# Patient Record
Sex: Male | Born: 1980 | Race: Black or African American | Hispanic: Yes | Marital: Single
Health system: Midwestern US, Community
[De-identification: ages and names within clinical notes are randomized; demographics above are authoritative.]

## PROBLEM LIST (undated history)

## (undated) DIAGNOSIS — E1165 Type 2 diabetes mellitus with hyperglycemia: Principal | ICD-10-CM

## (undated) DIAGNOSIS — J4520 Mild intermittent asthma, uncomplicated: Secondary | ICD-10-CM

## (undated) DIAGNOSIS — E1149 Type 2 diabetes mellitus with other diabetic neurological complication: Secondary | ICD-10-CM

## (undated) DIAGNOSIS — N183 Chronic kidney disease, stage 3 unspecified: Secondary | ICD-10-CM

## (undated) DIAGNOSIS — J45909 Unspecified asthma, uncomplicated: Secondary | ICD-10-CM

## (undated) DIAGNOSIS — E669 Obesity, unspecified: Secondary | ICD-10-CM

## (undated) DIAGNOSIS — E1122 Type 2 diabetes mellitus with diabetic chronic kidney disease: Secondary | ICD-10-CM

## (undated) HISTORY — PX: CHOLECYSTECTOMY: SHX55

---

## 2015-08-23 ENCOUNTER — Inpatient Hospital Stay (HOSPITAL_COMMUNITY)
Admission: EM | Admit: 2015-08-23 | Discharge: 2015-08-25 | DRG: 638 | Disposition: A | Discharge status: Home / Self Care | Payer: BLUE CROSS/BLUE SHIELD | Attending: Internal Medicine | Admitting: Internal Medicine

## 2015-08-23 ENCOUNTER — Encounter (HOSPITAL_COMMUNITY): Payer: Self-pay

## 2015-08-23 ENCOUNTER — Emergency Department (HOSPITAL_COMMUNITY): Payer: BLUE CROSS/BLUE SHIELD

## 2015-08-23 DIAGNOSIS — K219 Gastro-esophageal reflux disease without esophagitis: Secondary | ICD-10-CM | POA: Diagnosis present

## 2015-08-23 DIAGNOSIS — N179 Acute kidney failure, unspecified: Secondary | ICD-10-CM

## 2015-08-23 DIAGNOSIS — E876 Hypokalemia: Secondary | ICD-10-CM | POA: Diagnosis present

## 2015-08-23 DIAGNOSIS — E875 Hyperkalemia: Secondary | ICD-10-CM | POA: Diagnosis present

## 2015-08-23 DIAGNOSIS — J4520 Mild intermittent asthma, uncomplicated: Secondary | ICD-10-CM | POA: Diagnosis not present

## 2015-08-23 DIAGNOSIS — R634 Abnormal weight loss: Secondary | ICD-10-CM

## 2015-08-23 DIAGNOSIS — E872 Acidosis: Secondary | ICD-10-CM | POA: Diagnosis present

## 2015-08-23 DIAGNOSIS — E781 Pure hyperglyceridemia: Secondary | ICD-10-CM | POA: Diagnosis present

## 2015-08-23 DIAGNOSIS — E669 Obesity, unspecified: Secondary | ICD-10-CM

## 2015-08-23 DIAGNOSIS — R739 Hyperglycemia, unspecified: Secondary | ICD-10-CM

## 2015-08-23 DIAGNOSIS — Z23 Encounter for immunization: Secondary | ICD-10-CM

## 2015-08-23 DIAGNOSIS — E860 Dehydration: Secondary | ICD-10-CM | POA: Diagnosis present

## 2015-08-23 DIAGNOSIS — Z6831 Body mass index (BMI) 31.0-31.9, adult: Secondary | ICD-10-CM | POA: Diagnosis not present

## 2015-08-23 DIAGNOSIS — Z833 Family history of diabetes mellitus: Secondary | ICD-10-CM | POA: Diagnosis not present

## 2015-08-23 DIAGNOSIS — R7309 Other abnormal glucose: Secondary | ICD-10-CM

## 2015-08-23 DIAGNOSIS — E1165 Type 2 diabetes mellitus with hyperglycemia: Secondary | ICD-10-CM | POA: Diagnosis present

## 2015-08-23 DIAGNOSIS — J45909 Unspecified asthma, uncomplicated: Secondary | ICD-10-CM

## 2015-08-23 DIAGNOSIS — R112 Nausea with vomiting, unspecified: Secondary | ICD-10-CM

## 2015-08-23 DIAGNOSIS — D696 Thrombocytopenia, unspecified: Secondary | ICD-10-CM

## 2015-08-23 HISTORY — DX: Unspecified asthma, uncomplicated: J45.909

## 2015-08-23 HISTORY — DX: Obesity, unspecified: E66.9

## 2015-08-23 LAB — URINALYSIS, ROUTINE W REFLEX MICROSCOPIC
BILIRUBIN URINE: NEGATIVE
Glucose, UA: >1000 mg/dL — AB
KETONES UR: 40 mg/dL — AB
LEUKOCYTES UA: NEGATIVE
NITRITE: NEGATIVE
PH: 5 (ref 5.0–8.0)
PROTEIN: NEGATIVE mg/dL
Specific Gravity, Urine: 1.035 — ABNORMAL HIGH (ref 1.005–1.030)
Urobilinogen, UA: 0.2 mg/dL (ref 0.0–1.0)

## 2015-08-23 LAB — COMPREHENSIVE METABOLIC PANEL
ALK PHOS: 103 U/L (ref 38–126)
ALT: 35 U/L (ref 17–63)
ANION GAP: 20 — AB (ref 5–15)
AST: 32 U/L (ref 15–41)
Albumin: 3.9 g/dL (ref 3.5–5.0)
BILIRUBIN TOTAL: 1.3 mg/dL — AB (ref 0.3–1.2)
BUN: 31 mg/dL — ABNORMAL HIGH (ref 6–20)
CALCIUM: 9.6 mg/dL (ref 8.9–10.3)
CO2: 17 mmol/L — ABNORMAL LOW (ref 22–32)
Chloride: 86 mmol/L — ABNORMAL LOW (ref 101–111)
Creatinine, Ser: 2.41 mg/dL — ABNORMAL HIGH (ref 0.61–1.24)
GFR, EST AFRICAN AMERICAN: 38 mL/min — AB (ref >60)
GFR, EST NON AFRICAN AMERICAN: 33 mL/min — AB (ref >60)
GLUCOSE: 1395 mg/dL — AB (ref 65–99)
POTASSIUM: 6.2 mmol/L — AB (ref 3.5–5.1)
Sodium: 123 mmol/L — ABNORMAL LOW (ref 135–145)
TOTAL PROTEIN: 9.1 g/dL — AB (ref 6.5–8.1)

## 2015-08-23 LAB — CBC WITH DIFFERENTIAL/PLATELET
BASOS ABS: 0 10*3/uL (ref 0.0–0.1)
BASOS PCT: 0 %
EOS ABS: 0 10*3/uL (ref 0.0–0.7)
Eosinophils Relative: 0 %
HCT: 44.1 % (ref 39.0–52.0)
HEMOGLOBIN: 15.7 g/dL (ref 13.0–17.0)
Lymphocytes Relative: 19 %
Lymphs Abs: 1.3 10*3/uL (ref 0.7–4.0)
MCH: 27.6 pg (ref 26.0–34.0)
MCHC: 35.6 g/dL (ref 30.0–36.0)
MCV: 77.5 fL — ABNORMAL LOW (ref 78.0–100.0)
MONO ABS: 0.3 10*3/uL (ref 0.1–1.0)
MONOS PCT: 4 %
NEUTROS ABS: 5.4 10*3/uL (ref 1.7–7.7)
NEUTROS PCT: 77 %
Platelets: 90 10*3/uL — ABNORMAL LOW (ref 150–400)
RBC: 5.69 MIL/uL (ref 4.22–5.81)
RDW: 12.4 % (ref 11.5–15.5)
WBC: 6.9 10*3/uL (ref 4.0–10.5)

## 2015-08-23 LAB — BASIC METABOLIC PANEL
ANION GAP: 19 — AB (ref 5–15)
BUN: 28 mg/dL — ABNORMAL HIGH (ref 6–20)
CALCIUM: 9.9 mg/dL (ref 8.9–10.3)
CHLORIDE: 102 mmol/L (ref 101–111)
CO2: 17 mmol/L — AB (ref 22–32)
Creatinine, Ser: 2.14 mg/dL — ABNORMAL HIGH (ref 0.61–1.24)
GFR calc non Af Amer: 38 mL/min — ABNORMAL LOW (ref >60)
GFR, EST AFRICAN AMERICAN: 44 mL/min — AB (ref >60)
Glucose, Bld: 730 mg/dL (ref 65–99)
POTASSIUM: 4.5 mmol/L (ref 3.5–5.1)
Sodium: 138 mmol/L (ref 135–145)

## 2015-08-23 LAB — CBG MONITORING, ED
Glucose-Capillary: >600 mg/dL (ref 65–99)
Glucose-Capillary: >600 mg/dL (ref 65–99)

## 2015-08-23 LAB — URINE MICROSCOPIC-ADD ON

## 2015-08-23 LAB — I-STAT VENOUS BLOOD GAS, ED
Acid-base deficit: 7 mmol/L — ABNORMAL HIGH (ref 0.0–2.0)
Bicarbonate: 18.9 meq/L — ABNORMAL LOW (ref 20.0–24.0)
O2 Saturation: 84 %
TCO2: 20 mmol/L (ref 0–100)
pCO2, Ven: 38.4 mmHg — ABNORMAL LOW (ref 45.0–50.0)
pH, Ven: 7.3 (ref 7.250–7.300)
pO2, Ven: 54 mmHg — ABNORMAL HIGH (ref 30.0–45.0)

## 2015-08-23 LAB — MAGNESIUM: Magnesium: 3 mg/dL — ABNORMAL HIGH (ref 1.7–2.4)

## 2015-08-23 LAB — LIPASE, BLOOD: Lipase: 42 U/L (ref 11–51)

## 2015-08-23 LAB — BETA-HYDROXYBUTYRIC ACID: BETA-HYDROXYBUTYRIC ACID: 4.9 mmol/L — AB (ref 0.05–0.27)

## 2015-08-23 LAB — GLUCOSE, CAPILLARY: Glucose-Capillary: 453 mg/dL — ABNORMAL HIGH (ref 65–99)

## 2015-08-23 LAB — MRSA PCR SCREENING: MRSA BY PCR: NEGATIVE

## 2015-08-23 LAB — PHOSPHORUS: PHOSPHORUS: 3.7 mg/dL (ref 2.5–4.6)

## 2015-08-23 MED ORDER — DEXTROSE-NACL 5-0.45 % IV SOLN
INTRAVENOUS | Status: DC
Start: 2015-08-23 — End: 2015-08-23

## 2015-08-23 MED ORDER — SODIUM CHLORIDE 0.9 % IV SOLN
INTRAVENOUS | Status: DC
  Administered 2015-08-23: 5.4 [IU]/h via INTRAVENOUS
  Filled 2015-08-23: qty 2.5

## 2015-08-23 MED ORDER — PANTOPRAZOLE SODIUM 40 MG PO TBEC
40.0000 mg | DELAYED_RELEASE_TABLET | Freq: Every day | ORAL | Status: DC
  Administered 2015-08-23 – 2015-08-25 (×3): 40 mg via ORAL
  Filled 2015-08-23 (×3): qty 1

## 2015-08-23 MED ORDER — INSULIN REGULAR HUMAN 100 UNIT/ML IJ SOLN: INTRAMUSCULAR | Status: DC

## 2015-08-23 MED ORDER — DEXTROSE 50 % IV SOLN: 25.0000 mL | INTRAVENOUS | Status: DC | PRN

## 2015-08-23 MED ORDER — INFLUENZA VAC SPLIT QUAD 0.5 ML IM SUSY
0.5000 mL | PREFILLED_SYRINGE | INTRAMUSCULAR | Status: AC
  Administered 2015-08-24: 0.5 mL via INTRAMUSCULAR
  Filled 2015-08-23: qty 0.5

## 2015-08-23 MED ORDER — HEPARIN SODIUM (PORCINE) 5000 UNIT/ML IJ SOLN
5000.0000 [IU] | Freq: Three times a day (TID) | INTRAMUSCULAR | Status: DC
  Administered 2015-08-23 – 2015-08-25 (×6): 5000 [IU] via SUBCUTANEOUS
  Filled 2015-08-23 (×6): qty 1

## 2015-08-23 MED ORDER — ALBUTEROL SULFATE (2.5 MG/3ML) 0.083% IN NEBU: 2.5000 mg | INHALATION_SOLUTION | RESPIRATORY_TRACT | Status: AC | PRN

## 2015-08-23 MED ORDER — DEXTROSE-NACL 5-0.45 % IV SOLN
INTRAVENOUS | Status: DC
  Administered 2015-08-24 (×2): via INTRAVENOUS

## 2015-08-23 MED ORDER — ALBUTEROL SULFATE (2.5 MG/3ML) 0.083% IN NEBU: 2.5000 mg | INHALATION_SOLUTION | Freq: Four times a day (QID) | RESPIRATORY_TRACT | Status: DC | PRN

## 2015-08-23 MED ORDER — ALBUTEROL SULFATE HFA 108 (90 BASE) MCG/ACT IN AERS: 2.0000 | INHALATION_SPRAY | Freq: Four times a day (QID) | RESPIRATORY_TRACT | Status: DC | PRN

## 2015-08-23 MED ORDER — SODIUM CHLORIDE 0.9 % IV BOLUS (SEPSIS)
1000.0000 mL | Freq: Once | INTRAVENOUS | Status: AC
  Administered 2015-08-23: 1000 mL via INTRAVENOUS

## 2015-08-23 MED ORDER — ALBUTEROL SULFATE (2.5 MG/3ML) 0.083% IN NEBU
2.5000 mg | INHALATION_SOLUTION | Freq: Four times a day (QID) | RESPIRATORY_TRACT | Status: DC | PRN
  Administered 2015-08-24: 2.5 mg via RESPIRATORY_TRACT
  Filled 2015-08-23: qty 3

## 2015-08-23 MED ORDER — ONDANSETRON HCL 4 MG/2ML IJ SOLN: 4.0000 mg | Freq: Three times a day (TID) | INTRAMUSCULAR | Status: AC | PRN

## 2015-08-23 MED ORDER — INSULIN REGULAR BOLUS VIA INFUSION
0.0000 [IU] | Freq: Three times a day (TID) | INTRAVENOUS | Status: DC
  Administered 2015-08-24: 3.9 [IU] via INTRAVENOUS
  Filled 2015-08-23: qty 10

## 2015-08-23 MED ORDER — SODIUM CHLORIDE 0.9 % IV SOLN
INTRAVENOUS | Status: DC
  Administered 2015-08-24: 02:00:00 via INTRAVENOUS

## 2015-08-23 MED ORDER — ONDANSETRON HCL 4 MG PO TABS: 4.0000 mg | ORAL_TABLET | Freq: Four times a day (QID) | ORAL | Status: DC | PRN

## 2015-08-23 MED ORDER — ONDANSETRON HCL 4 MG/2ML IJ SOLN: 4.0000 mg | Freq: Four times a day (QID) | INTRAMUSCULAR | Status: DC | PRN

## 2015-08-23 MED ORDER — SODIUM CHLORIDE 0.9 % IJ SOLN
3.0000 mL | Freq: Two times a day (BID) | INTRAMUSCULAR | Status: DC
  Administered 2015-08-24 – 2015-08-25 (×4): 3 mL via INTRAVENOUS

## 2015-08-23 MED ORDER — SODIUM CHLORIDE 0.9 % IV BOLUS (SEPSIS)
1000.0000 mL | Freq: Once | INTRAVENOUS | Status: AC
Start: 2015-08-23 — End: 2015-08-23
  Administered 2015-08-23: 1000 mL via INTRAVENOUS

## 2015-08-23 NOTE — ED Notes (Signed)
Developed generalized abdominal pain 5 days ago and yesterday began having n/v.   Mild loose stools.  Pt. Reports loosing 30  Lbs in 2 weeks.  Skin is warm and dry. Alert and oriented X4.

## 2015-08-23 NOTE — H&P (Addendum)
[facility] History and Physical  Jaymere Alen FAO:[id number] DOB: 1981-07-15 DOA: 08/23/2015  Referring physician: ED PCP: No primary care provider on file.   Chief Complaint:   HPI:  Mr. Gravley is a 34 y/o male with a past medical history significant for obesity, asthma, and GERD; who presents with nausea, vomiting, and abdominal pain. Symptoms initially started a week or so ago with just generalized malaise. Then approximately 5 days ago patient noted onset of generalized crampy like abdominal pain that would come and go. In the last 3 days he noted new symptoms of blurry vision. Patient is nearsighted and wears glasses at baseline. Associated symptoms include excessive thirst, frequency of urination, numbness tingling sensations intermittently. He denies any shortness of breath or chest pain symptoms.  In the last 2 weeks he went from weighing 266 pounds down to 236 pounds and family had begun to notice that he lost his belly. Patient denied working out lose the weight or any dietary changes. He also notes that his mom and dad both have diabetes. Patient notes today he continued to feel somewhat nauseous and was determined to vomit as he describes it feels like food just sits on his stomach. Determined to vomit proceeded to [employer] and got a meal with milkshake and vomited twice thereafter. Still not feeling well and he decided it was time to come to the emergency department. Once he was admitted into the emergency department blood glucose level was seen to be 1395.  Mr. Huckeby also brings up the fact that he has a history of asthma that he has not had an inhaler for some time. He reports the last time he was hospitalized for a asthma exacerbation was some time in June prior to the moving from [place] down to [place]. Patient also notes that the last time he had a primary care doctor was approximately a year ago.  Review of Systems  Constitutional: Positive for weight loss and  malaise/fatigue.  HENT: Negative for hearing loss and tinnitus.   Eyes: Positive for blurred vision. Negative for double vision and photophobia.  Respiratory: Positive for wheezing. Negative for cough and hemoptysis.   Cardiovascular: Negative for chest pain and palpitations.  Gastrointestinal: Positive for nausea, vomiting, abdominal pain and diarrhea. Negative for constipation.  Genitourinary: Positive for frequency. Negative for flank pain.  Musculoskeletal: Negative for falls and neck pain.  Skin: Negative for itching and rash.  Neurological: Positive for tremors. Negative for tingling and focal weakness.  Endo/Heme/Allergies: Positive for polydipsia. Does not bruise/bleed easily.  Psychiatric/Behavioral: Negative for hallucinations. The patient is not nervous/anxious.        Past Medical History  Diagnosis Date  . Asthma      Past Surgical History  Procedure Laterality Date  . Cholecystectomy        Social History:  reports that he has never smoked. He does not have any smokeless tobacco history on file. He reports that he does not drink alcohol or use illicit drugs. where does patient live--home  and with whom if at home? wife Can patient participate in ADLs? Yes  No Known Allergies  Family History  Problem Relation Age of Onset  . Diabetes Mother   . Diabetes Father        Prior to Admission medications   Medication Sig Start Date End Date Taking? Authorizing Provider  albuterol (PROVENTIL HFA;VENTOLIN HFA) 108 (90 BASE) MCG/ACT inhaler Inhale 2 puffs into the lungs every 6 (six) hours as needed for wheezing  or shortness of breath.   Yes Historical Provider, MD  albuterol (PROVENTIL) (2.5 MG/3ML) 0.083% nebulizer solution Take 2.5 mg by nebulization every 6 (six) hours as needed for wheezing or shortness of breath.   Yes Historical Provider, MD     Physical Exam: Filed Vitals:   08/23/15 1606 08/23/15 1831 08/23/15 1845 08/23/15 2015  BP: 135/88 138/90  143/99 140/90  Pulse: 112  101 98  Temp: 98 F (36.7 C)     TempSrc: Oral     Resp: Height: 6' (1.829 m)     Weight: 107.094 kg (236 lb 1.6 oz)     SpO2: 95% 96% 94% 96%     Constitutional: Vital signs reviewed. Patient is a obese gentleman that appears to be in no acute distress this time. Alert and oriented 3  Head: Normocephalic and atraumatic  Ear: TM normal bilaterally  Mouth: no erythema or exudates, dry mucous membranes with poor dentition Eyes: PERRL, EOMI, conjunctivae normal, No scleral icterus.  Neck: Supple, Trachea midline normal ROM, No JVD, mass, thyromegaly, or carotid bruit present.  Cardiovascular: Slightly tachycardic, S1 normal, S2 normal, no MRG, pulses symmetric and intact bilaterally  Pulmonary/Chest: CTAB, no wheezes, rales, or rhonchi  Abdominal: Soft. Non-tender, non-distended, bowel sounds are normal, no masses, organomegaly, or guarding present.  GU: no CVA tenderness Musculoskeletal: No joint deformities, erythema, or stiffness, ROM full and no nontender Ext: no edema and no cyanosis, pulses palpable bilaterally (DP and PT)  Hematology: no cervical, inginal, or axillary adenopathy.  Neurological: A&O x3, Strenght is normal and symmetric bilaterally, cranial nerve II-XII are grossly intact, no focal motor deficit, sensory intact to light touch bilaterally.  Skin: Warm, dry and intact. No rash, cyanosis, or clubbing. Poor skin turgor  Psychiatric: Normal mood and affect. speech and behavior is normal. Judgment and thought content normal. Cognition and memory are normal.      Data Review   Micro Results No results found for this or any previous visit (from the past 240 hour(s)).  Radiology Reports Dg Chest 2 View  08/23/2015  CLINICAL DATA:  Blurred vision, weakness and dizziness. EXAM: CHEST  2 VIEW COMPARISON:  None. FINDINGS: The cardiac silhouette, mediastinal and hilar contours are within normal limits. Day streaky density in the  left lower lobe could reflect atelectasis or scar. No pleural effusion. The bony thorax is intact. IMPRESSION: Streaky left lower lobe atelectasis or scarring. No definite infiltrates, effusions or edema. Electronically Signed   By: Rudie Meyer M.D.   On: 08/23/2015 19:19     CBC  Recent Labs Lab 08/23/15 1656  WBC 6.9  HGB 15.7  HCT 44.1  PLT 90*  MCV 77.5*  MCH 27.6  MCHC 35.6  RDW 12.4  LYMPHSABS 1.3  MONOABS 0.3  EOSABS 0.0  BASOSABS 0.0    Chemistries   Recent Labs Lab 08/23/15 1619  NA 123*  K 6.2*  CL 86*  CO2 17*  GLUCOSE 1395*  BUN 31*  CREATININE 2.41*  CALCIUM 9.6  AST 32  ALT 35  ALKPHOS 103  BILITOT 1.3*   ------------------------------------------------------------------------------------------------------------------ estimated creatinine clearance is 54.1 mL/min (by C-G formula based on Cr of 2.41). ------------------------------------------------------------------------------------------------------------------ No results for input(s): HGBA1C in the last 72 hours. ------------------------------------------------------------------------------------------------------------------ No results for input(s): CHOL, HDL, LDLCALC, TRIG, CHOLHDL, LDLDIRECT in the last 72 hours. ------------------------------------------------------------------------------------------------------------------ No results for input(s): TSH, T4TOTAL, T3FREE, THYROIDAB in the last 72 hours.  Invalid input(s): FREET3 ------------------------------------------------------------------------------------------------------------------ No results for input(s):  VITAMINB12, FOLATE, FERRITIN, TIBC, IRON, RETICCTPCT in the last 72 hours.  Coagulation profile No results for input(s): INR, PROTIME in the last 168 hours.  No results for input(s): DDIMER in the last 72 hours.  Cardiac Enzymes No results for input(s): CKMB, TROPONINI, MYOGLOBIN in the last 168 hours.  Invalid input(s):  CK ------------------------------------------------------------------------------------------------------------------ Invalid input(s): POCBNP   CBG:  Recent Labs Lab 08/23/15 1943  GLUCAP >600*       EKG: pending   Assessment/Plan Principal Problem:  Hyperglycemia: Acute. Patient with symptoms of nausea, vomiting, polydipsia, and urinary. frequency positive family history of diabetes. Initial blood glucose 1395, anion gap of 20, Venous pH 7.3, positive ketones on UA. Suspect DKA in a new onset diabetic. This likely is the cause of patient's rapid weight loss, blurred vision, N/V symptoms -Check beta hydroxybutyrate -Glucose stabilizer protocol -BMPs every 4 hours -IV fluids normal saline -Check mag and phosphorus -Diabetic education in a.m. -Modified diet as tolerated -zofran prn nausea   AKI (acute kidney injury):Acute. Baseline Cr unknown.  Cr 2.41 with BUN 34  on admission. Likely due to prerenal secondary to severe dehydration. -IVF -Check FeNa and US-renal -Follow up renal function by BMP -Avoid ACEI and NSAIDs   Asthma: Stable - prn Albuterol  -recommended Flu vaccine     Obesity: -Counseled the patient on the need of weight loss and exercise    Hyperkalemia: Potassium noted to be 6.2 on admission. -Check EKG now -BMP repeat   Thrombocytopenia (HCC) -rechecking CBC in am  Code Status:   full Family Communication: bedside Disposition Plan: admit   Total time spent 55 minutes.Greater than 50% of this time was spent in counseling, explanation of diagnosis, planning of further management, and coordination of care  Clydie BraunRondell A Tiasia Weberg [facility] Pager [phone]  If 7PM-7AM, please contact night-coverage [email or web address] Password [email or web address] 08/23/2015, 8:39 PM

## 2015-08-23 NOTE — ED Notes (Signed)
Glucose called of 1300 plus

## 2015-08-23 NOTE — ED Notes (Signed)
Attempted report 

## 2015-08-23 NOTE — ED Provider Notes (Signed)
CSN: [id number]     Arrival date & time 08/23/15  1544 History   First MD Initiated Contact with Patient 08/23/15 1810     Chief Complaint  Patient presents with  . Abdominal Pain   HPI  Nicholas Barrett is a 34 year old male with PMHx of asthma presenting with abdominal pain, nausea and vomiting. Patient reports that his abdominal pain began approximately 5 days ago. It came on gradually and is described as generalized and cramping. He is also having associated nausea and vomiting. He states that after vomiting the pain resolves for a short time but soon returns. He is also complaining of loose stools but states that this is common since he had a cholecystectomy 5 years ago. Denies blood in vomit or stool. He is also noted that his vision has been blurred over the past 3 days, he gives the example of not being able to see the TV as well as he used to. He is also complaining of swelling of the lower extremities. He noted some swelling over his ankles and feet over the past week. He is also complaining of a 30 pound weight loss over the past 2 weeks. He denies dieting or exercise. He states he feels that his mouth is very dry and he is constantly thirsty. He is also complaining of increased urinary frequency. He states that he does not have a primary care doctor is unsure when the last time he saw one was. He states that he has no personal history of diabetes but he has a strong family history of it. Denies fevers, chills, headaches, chest pain, shortness of breath, wheezing, dysuria or hematuria.  Past Medical History  Diagnosis Date  . Asthma    Past Surgical History  Procedure Laterality Date  . Cholecystectomy     No family history on file. Social History  Substance Use Topics  . Smoking status: Never Smoker   . Smokeless tobacco: None  . Alcohol Use: No    Review of Systems  Constitutional: Positive for fatigue and unexpected weight change. Negative for fever and chills.  Eyes: Positive for  visual disturbance.  Respiratory: Negative for cough, shortness of breath and wheezing.   Cardiovascular: Positive for leg swelling. Negative for chest pain and palpitations.  Gastrointestinal: Positive for nausea, vomiting, abdominal pain and diarrhea. Negative for abdominal distention.  Endocrine: Positive for polydipsia and polyuria.  Genitourinary: Negative for dysuria, hematuria and flank pain.  Musculoskeletal: Negative for myalgias and arthralgias.  Skin: Negative for rash.  Neurological: Negative for dizziness, syncope, weakness and headaches.  All other systems reviewed and are negative.     Allergies  Review of patient's allergies indicates no known allergies.  Home Medications   Prior to Admission medications   Medication Sig Start Date End Date Taking? Authorizing Provider  albuterol (PROVENTIL HFA;VENTOLIN HFA) 108 (90 BASE) MCG/ACT inhaler Inhale 2 puffs into the lungs every 6 (six) hours as needed for wheezing or shortness of breath.   Yes Historical Provider, MD  albuterol (PROVENTIL) (2.5 MG/3ML) 0.083% nebulizer solution Take 2.5 mg by nebulization every 6 (six) hours as needed for wheezing or shortness of breath.   Yes Historical Provider, MD   BP 131/83 mmHg  Pulse 98  Temp(Src) 98.1 F (36.7 C) (Oral)  Resp 17  Ht 6' (1.829 m)  Wt 230 lb 11.2 oz (104.645 kg)  BMI 31.28 kg/m2  SpO2 95% Physical Exam  Constitutional: He is oriented to person, place, and time. He appears  well-developed and well-nourished. No distress.  HENT:  Head: Normocephalic and atraumatic.  Mouth/Throat: Mucous membranes are dry.  Dry mucous membranes  Eyes: Conjunctivae and EOM are normal. Pupils are equal, round, and reactive to light. Right eye exhibits no discharge. Left eye exhibits no discharge. No scleral icterus.  Neck: Normal range of motion. Neck supple.  Cardiovascular: Normal rate, regular rhythm, normal heart sounds and intact distal pulses.   No peripheral edema.   Pulmonary/Chest: Effort normal and breath sounds normal. No respiratory distress. He has no wheezes. He has no rales.  Abdominal: Soft. Bowel sounds are normal. He exhibits no distension. There is no tenderness. There is no rebound and no guarding.  Abdomen is soft, nontender. No peritoneal signs.  Musculoskeletal: Normal range of motion. He exhibits no edema or tenderness.  No obvious deformities or edema. Patient moves all extremities spontaneously.  Neurological: He is alert and oriented to person, place, and time. He exhibits normal muscle tone. Coordination normal.  CNs intact. 5/5 strength in all major muscle groups. Sensation to light touch intact throughout.  Skin: Skin is warm and dry. No rash noted.  Psychiatric: He has a normal mood and affect. His behavior is normal.  Nursing note and vitals reviewed.   ED Course  Procedures (including critical care time) Labs Review Labs Reviewed  COMPREHENSIVE METABOLIC PANEL - Abnormal; Notable for the following:    Sodium 123 (*)    Potassium 6.2 (*)    Chloride 86 (*)    CO2 17 (*)    Glucose, Bld 1395 (*)    BUN 31 (*)    Creatinine, Ser 2.41 (*)    Total Protein 9.1 (*)    Total Bilirubin 1.3 (*)    GFR calc non Af Amer 33 (*)    GFR calc Af Amer 38 (*)    Anion gap 20 (*)    All other components within normal limits  URINALYSIS, ROUTINE W REFLEX MICROSCOPIC (NOT AT [facility]) - Abnormal; Notable for the following:    Specific Gravity, Urine 1.035 (*)    Glucose, UA >1000 (*)    Hgb urine dipstick TRACE (*)    Ketones, ur 40 (*)    All other components within normal limits  CBC WITH DIFFERENTIAL/PLATELET - Abnormal; Notable for the following:    MCV 77.5 (*)    Platelets 90 (*)    All other components within normal limits  BASIC METABOLIC PANEL - Abnormal; Notable for the following:    CO2 17 (*)    Glucose, Bld 730 (*)    BUN 28 (*)    Creatinine, Ser 2.14 (*)    GFR calc non Af Amer 38 (*)    GFR calc Af Amer 44 (*)     Anion gap 19 (*)    All other components within normal limits  MAGNESIUM - Abnormal; Notable for the following:    Magnesium 3.0 (*)    All other components within normal limits  BETA-HYDROXYBUTYRIC ACID - Abnormal; Notable for the following:    Beta-Hydroxybutyric Acid 4.90 (*)    All other components within normal limits  I-STAT VENOUS BLOOD GAS, ED - Abnormal; Notable for the following:    pCO2, Ven 38.4 (*)    pO2, Ven 54.0 (*)    Bicarbonate 18.9 (*)    Acid-base deficit 7.0 (*)    All other components within normal limits  CBG MONITORING, ED - Abnormal; Notable for the following:    Glucose-Capillary >600 (*)  All other components within normal limits  CBG MONITORING, ED - Abnormal; Notable for the following:    Glucose-Capillary >600 (*)    All other components within normal limits  MRSA PCR SCREENING  LIPASE, BLOOD  URINE MICROSCOPIC-ADD ON  PHOSPHORUS  CBC  COMPREHENSIVE METABOLIC PANEL    Imaging Review Dg Chest 2 View  08/23/2015  CLINICAL DATA:  Blurred vision, weakness and dizziness. EXAM: CHEST  2 VIEW COMPARISON:  None. FINDINGS: The cardiac silhouette, mediastinal and hilar contours are within normal limits. Day streaky density in the left lower lobe could reflect atelectasis or scar. No pleural effusion. The bony thorax is intact. IMPRESSION: Streaky left lower lobe atelectasis or scarring. No definite infiltrates, effusions or edema. Electronically Signed   By: Rudie Meyer M.D.   On: 08/23/2015 19:19   I have personally reviewed and evaluated these images and lab results as part of my medical decision-making.   EKG Interpretation None      MDM   Final diagnoses:  Hyperglycemia   Patient presenting with 5 day history of abdominal pain, nausea and vomiting. She has symptoms include blurred vision, weight loss, polydipsia and polyuria. Patient denies past medical history of diabetes. Slightly tachycardia to 101. Patient is nontoxic-appearing but does  seem to be slightly anxious. Abdomen is soft, nontender. Lungs are clear to auscultation bilaterally. Heart regular rate and rhythm. Moves all extremities spontaneously without peripheral edema. Noted to have glucose of 1395, potassium of 6.2, creatinine of 2.41, BUN of 31, anion gap of 19 and urine positive for ketones. Started patient on glucose stabilizer and fluids. Resulted hospitalist, Dr. Katrinka Blazing, for admission for DKA. Dr. Katrinka Blazing accepts patient to stepdown.    Rolm Gala Ariah Mower, PA-C 08/23/15 2358  Benjiman Core, MD 08/26/15 1150

## 2015-08-24 ENCOUNTER — Inpatient Hospital Stay (HOSPITAL_COMMUNITY): Payer: BLUE CROSS/BLUE SHIELD

## 2015-08-24 ENCOUNTER — Encounter (HOSPITAL_COMMUNITY): Payer: Self-pay | Admitting: Internal Medicine

## 2015-08-24 DIAGNOSIS — R739 Hyperglycemia, unspecified: Secondary | ICD-10-CM

## 2015-08-24 DIAGNOSIS — R112 Nausea with vomiting, unspecified: Secondary | ICD-10-CM | POA: Diagnosis present

## 2015-08-24 LAB — CREATININE, URINE, RANDOM: Creatinine, Urine: 245.61 mg/dL

## 2015-08-24 LAB — COMPREHENSIVE METABOLIC PANEL
ALK PHOS: 80 U/L (ref 38–126)
ALT: 31 U/L (ref 17–63)
ANION GAP: 12 (ref 5–15)
AST: 33 U/L (ref 15–41)
Albumin: 3.8 g/dL (ref 3.5–5.0)
BUN: 23 mg/dL — ABNORMAL HIGH (ref 6–20)
CALCIUM: 9.8 mg/dL (ref 8.9–10.3)
CO2: 21 mmol/L — AB (ref 22–32)
Chloride: 115 mmol/L — ABNORMAL HIGH (ref 101–111)
Creatinine, Ser: 1.68 mg/dL — ABNORMAL HIGH (ref 0.61–1.24)
GFR calc non Af Amer: 51 mL/min — ABNORMAL LOW (ref >60)
GFR, EST AFRICAN AMERICAN: 59 mL/min — AB (ref >60)
Glucose, Bld: 244 mg/dL — ABNORMAL HIGH (ref 65–99)
Potassium: 4.1 mmol/L (ref 3.5–5.1)
SODIUM: 148 mmol/L — AB (ref 135–145)
Total Bilirubin: 1 mg/dL (ref 0.3–1.2)
Total Protein: 8.7 g/dL — ABNORMAL HIGH (ref 6.5–8.1)

## 2015-08-24 LAB — GLUCOSE, CAPILLARY
GLUCOSE-CAPILLARY: 165 mg/dL — AB (ref 65–99)
GLUCOSE-CAPILLARY: 179 mg/dL — AB (ref 65–99)
GLUCOSE-CAPILLARY: 189 mg/dL — AB (ref 65–99)
GLUCOSE-CAPILLARY: 235 mg/dL — AB (ref 65–99)
GLUCOSE-CAPILLARY: 304 mg/dL — AB (ref 65–99)
GLUCOSE-CAPILLARY: 325 mg/dL — AB (ref 65–99)
GLUCOSE-CAPILLARY: 350 mg/dL — AB (ref 65–99)
GLUCOSE-CAPILLARY: 439 mg/dL — AB (ref 65–99)
Glucose-Capillary: 266 mg/dL — ABNORMAL HIGH (ref 65–99)
Glucose-Capillary: 221 mg/dL — ABNORMAL HIGH (ref 65–99)
Glucose-Capillary: 203 mg/dL — ABNORMAL HIGH (ref 65–99)
Glucose-Capillary: 216 mg/dL — ABNORMAL HIGH (ref 65–99)
Glucose-Capillary: 183 mg/dL — ABNORMAL HIGH (ref 65–99)
Glucose-Capillary: 204 mg/dL — ABNORMAL HIGH (ref 65–99)
Glucose-Capillary: 299 mg/dL — ABNORMAL HIGH (ref 65–99)

## 2015-08-24 LAB — CBC
HCT: 46.7 % (ref 39.0–52.0)
HEMOGLOBIN: 16 g/dL (ref 13.0–17.0)
MCH: 27.5 pg (ref 26.0–34.0)
MCHC: 34.3 g/dL (ref 30.0–36.0)
MCV: 80.4 fL (ref 78.0–100.0)
PLATELETS: 246 10*3/uL (ref 150–400)
RBC: 5.81 MIL/uL (ref 4.22–5.81)
RDW: 12.8 % (ref 11.5–15.5)
WBC: 7.9 10*3/uL (ref 4.0–10.5)

## 2015-08-24 LAB — SODIUM, URINE, RANDOM: SODIUM UR: 23 mmol/L

## 2015-08-24 MED ORDER — INSULIN ASPART 100 UNIT/ML ~~LOC~~ SOLN
0.0000 [IU] | Freq: Three times a day (TID) | SUBCUTANEOUS | Status: DC
  Administered 2015-08-24: 8 [IU] via SUBCUTANEOUS

## 2015-08-24 MED ORDER — INSULIN ASPART 100 UNIT/ML ~~LOC~~ SOLN
0.0000 [IU] | Freq: Three times a day (TID) | SUBCUTANEOUS | Status: DC
  Administered 2015-08-24: 11 [IU] via SUBCUTANEOUS

## 2015-08-24 MED ORDER — INSULIN ASPART 100 UNIT/ML ~~LOC~~ SOLN
0.0000 [IU] | Freq: Three times a day (TID) | SUBCUTANEOUS | Status: DC
Start: 2015-08-25 — End: 2015-08-25
  Administered 2015-08-25: 11 [IU] via SUBCUTANEOUS
  Administered 2015-08-25: 15 [IU] via SUBCUTANEOUS
  Administered 2015-08-25: 11 [IU] via SUBCUTANEOUS

## 2015-08-24 MED ORDER — INSULIN GLARGINE 100 UNIT/ML ~~LOC~~ SOLN
40.0000 [IU] | Freq: Every day | SUBCUTANEOUS | Status: DC
  Administered 2015-08-24: 40 [IU] via SUBCUTANEOUS
  Filled 2015-08-24 (×2): qty 0.4

## 2015-08-24 MED ORDER — SODIUM CHLORIDE 0.45 % IV SOLN
INTRAVENOUS | Status: DC
  Administered 2015-08-24 – 2015-08-25 (×2): via INTRAVENOUS

## 2015-08-24 MED ORDER — INSULIN ASPART 100 UNIT/ML ~~LOC~~ SOLN
5.0000 [IU] | Freq: Three times a day (TID) | SUBCUTANEOUS | Status: DC
  Administered 2015-08-24 – 2015-08-25 (×3): 5 [IU] via SUBCUTANEOUS

## 2015-08-24 MED ORDER — INSULIN ASPART 100 UNIT/ML ~~LOC~~ SOLN
0.0000 [IU] | Freq: Every day | SUBCUTANEOUS | Status: DC
  Administered 2015-08-24: 4 [IU] via SUBCUTANEOUS

## 2015-08-24 MED ORDER — ALBUTEROL SULFATE HFA 108 (90 BASE) MCG/ACT IN AERS: 2.0000 | INHALATION_SPRAY | Freq: Four times a day (QID) | RESPIRATORY_TRACT | Status: DC | PRN

## 2015-08-24 MED ORDER — SODIUM CHLORIDE 0.9 % IV SOLN
INTRAVENOUS | Status: AC
  Administered 2015-08-24: 10.7 [IU]/h via INTRAVENOUS

## 2015-08-24 NOTE — Progress Notes (Signed)
Initial Nutrition Assessment  DOCUMENTATION CODES:  Obesity unspecified  INTERVENTION:  Gave DM education w/ associated handouts.   NUTRITION DIAGNOSIS:  Unintentional weight loss related to New onset DM as evidenced by reported loss of 30 lbs (11% bw) in 2 weeks   GOAL:  Patient will meet greater than or equal to 90% of their needs  MONITOR:  PO intake, Weight trends, Labs, I & O's (Education need)  REASON FOR ASSESSMENT:  Malnutrition Screening Tool    ASSESSMENT:  34 y/o male PMHx obesity, asmtha, GERD who presents with 1 week n/v/abdominal pain, 5 days of abdominal cramps and 3 days of blurry vision. Other symptoms include polyuria, polydipsia, numbness/tingling. Reports loss of 30 lbs in 2 weeks. Came to ED after vomiting from [employer] meal. In ED BG found to be 1395. Likely new onset DM.   Patient confirmed all of the above. As of right now he says his appetite is back and his symptoms have resolved with the insulin drip.   Though he has a family hx of DM, he reports knowing relatively little about the associated diet. RD was hesitant to provide education at this time due to perceived lethargy, but he assured RD that he could listen now.   RD provided "Diabetes Type 2 nutrition Therapy" Handout from the Academy of [employer] and Dietetics. Asked if pt knew what foods were counted as carbohydrates; he only knew grains. Discussed all different food groups and their effects on blood sugar.  Pt states that he does drink Sugar Sweetened Beverages (SSB). He drinks occasional sodas, juice and sweet tea. He is not interested in trying diet soda. He thought juice/fruit was very healthy. He says he will try artificial sweeteners in tea. Recommended water flavoring products.   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 He says he does not look for anything specific, just to make sure the food "isnt too bad".   Briefly explained how to carbohydrate count. Reccomended 5  servings per meal unless directed otherwise by MD. Provided list of carbohydrates and recommended serving sizes of common foods.   Discussed importance of controlled and consistent carbohydrate intake throughout the day. Provided "My Plate" template and talked about the correct ratios of food groups to optimally manage BG levels. Gave examples of ways to balance meals/snacks and encouraged intake of high-fiber, whole grain complex carbohydrates. Teach back method used.  Expect fair compliance. Pt was dozing off at parts of the conversation. May require I brief overview once he is more close to discharge3  [facility] contact information provided.   Pt says he is happy he lost the weight and declined any snacks/supplements at this time.   Diet Order:  Diet Carb Modified Fluid consistency:: Thin; Room service appropriate?: Yes  Skin:  Reviewed, no issues  Last BM:  unknown  Height:  Ht Readings from Last 1 Encounters:  08/23/15 6' (1.829 m)   Weight:  Wt Readings from Last 1 Encounters:  08/24/15 233 lb 4.8 oz (105.824 kg)   Dosing weightt: 230 lbs  Ideal Body Weight:  80.9 kg  BMI:  Body mass index is 31.63 kg/(m^2).  Estimated Nutritional Needs:  Kcal:  1900-2100 (18-20 kcal/kg bw) Protein:  81-97g (1-1.2 g/kg bw) Fluid:  1.9-2.1 liters  EDUCATION NEEDS:  Education needs addressed  Christophe LouisNathan Zamora Colton RD, LDN Nutrition Pager: [phone] 08/24/2015 10:35 AM

## 2015-08-24 NOTE — Progress Notes (Signed)
Pt transferred to 5W05, report given, condition stable.  Raymon MuttonGwen Fronia Depass RN

## 2015-08-24 NOTE — Progress Notes (Signed)
08/24/15  Call placed to 2600 to get report on patient, secretary stated nurse will [place] when [place].

## 2015-08-24 NOTE — Progress Notes (Deleted)
Pt's HR 130s-140s, currently 146. Otherwise HDS. Paged MD for med order to rate control. Will start dilt and continue to monitor. 

## 2015-08-24 NOTE — Progress Notes (Signed)
[facility] PROGRESS NOTE  Murl Golladay ZOX:[id number] DOB: 06-Feb-1981 DOA: 08/23/2015 PCP: No primary care provider on file.  Length of stay: 1   Assessment/Plan: Principal Problem:   Hyperglycemia Active Problems:   Weight loss   Asthma   Obesity   Hyperkalemia   AKI (acute kidney injury) (HCC)   Thrombocytopenia (HCC)   Nausea with vomiting    Mild diabetic ketoacidosis Started on DKA protocol overnight, anion gap is 20 upon admission, CBG 1395 CBG is improved Transition to Lantus, NovoLog pre-meal, sliding scale insulin Diabetes coordinator consult Check lipid panel  Acute kidney injury Creatinine improving to 2.14> 1.64 likely secondary to dehydration   Asthma-stable, continue when necessary albuterol  Hyperkalemia result  Morbid obesity Body mass index is 31.63 kg/(m^2). , Nutrition consult  Thrombocytopenia-resolved   DVT prophylaxsis heparin  Code Status:      Code Status Orders        Start     Ordered   08/23/15 2119  Full code   Continuous     08/23/15 2119     Family Communication: family updated about patient's clinical progress Disposition Plan:  Anticipate discharge tomorrow    Brief narrative: 34 y/o male with a past medical history significant for obesity, asthma, and GERD; who presents with nausea, vomiting, and abdominal pain. Symptoms initially started a week or so ago with just generalized malaise. Then approximately 5 days ago patient noted onset of generalized crampy like abdominal pain that would come and go. In the last 3 days he noted new symptoms of blurry vision. Patient is nearsighted and wears glasses at baseline. Associated symptoms include excessive thirst, frequency of urination, numbness tingling sensations intermittently. He denies any shortness of breath or chest pain symptoms. In the last 2 weeks he went from weighing 266 pounds down to 236 pounds and family had begun to notice that he lost his belly. Patient  denied working out lose the weight or any dietary changes. He also notes that his mom and dad both have diabetes. Patient notes today he continued to feel somewhat nauseous and was determined to vomit as he describes it feels like food just sits on his stomach. Determined to vomit proceeded to [employer] and got a meal with milkshake and vomited twice thereafter. Still not feeling well and he decided it was time to come to the emergency department. Once he was admitted into the emergency department blood glucose level was seen to be 1395.  Mr. Hollars also brings up the fact that he has a history of asthma that he has not had an inhaler for some time. He reports the last time he was hospitalized for a asthma exacerbation was some time in June prior to the moving from [place] down to [place]. Patient also notes that the last time he had a primary care doctor was approximately a year ago.  Consultants:  Diabetes coordinator  Procedures:  None  Antibiotics: Anti-infectives    None         HPI/Subjective: New-onset diabetes, does not complain of any chest pain shortness of breath, CBGs improved overnight  Objective: Filed Vitals:   08/24/15 0800 08/24/15 0900 08/24/15 1000 08/24/15 1100  BP: 109/86  117/93   Pulse: 100 101 91 100  Temp:      TempSrc:      Resp: Height:      Weight:      SpO2: 96% 95% 96% 95%  Intake/Output Summary (Last 24 hours) at 08/24/15 1141 Last data filed at 08/24/15 0900  Gross per 24 hour  Intake 3780.08 ml  Output   2475 ml  Net 1305.08 ml    Exam:  General: No acute respiratory distress Lungs: Clear to auscultation bilaterally without wheezes or crackles Cardiovascular: Regular rate and rhythm without murmur gallop or rub normal S1 and S2 Abdomen: Nontender, nondistended, soft, bowel sounds positive, no rebound, no ascites, no appreciable mass Extremities: No significant cyanosis, clubbing, or edema bilateral lower  extremities     Data Review   Micro Results Recent Results (from the past 240 hour(s))  MRSA PCR Screening     Status: None   Collection Time: 08/23/15  9:39 PM  Result Value Ref Range Status   MRSA by PCR NEGATIVE NEGATIVE Final    Comment:        The GeneXpert MRSA Assay (FDA approved for NASAL specimens only), is one component of a comprehensive MRSA colonization surveillance program. It is not intended to diagnose MRSA infection nor to guide or monitor treatment for MRSA infections.     Radiology Reports Dg Chest 2 View  08/23/2015  CLINICAL DATA:  Blurred vision, weakness and dizziness. EXAM: CHEST  2 VIEW COMPARISON:  None. FINDINGS: The cardiac silhouette, mediastinal and hilar contours are within normal limits. Day streaky density in the left lower lobe could reflect atelectasis or scar. No pleural effusion. The bony thorax is intact. IMPRESSION: Streaky left lower lobe atelectasis or scarring. No definite infiltrates, effusions or edema. Electronically Signed   By: Rudie MeyerP.  Gallerani M.D.   On: 08/23/2015 19:19     CBC  Recent Labs Lab 08/23/15 1656 08/24/15 0340  WBC 6.9 7.9  HGB 15.7 16.0  HCT 44.1 46.7  PLT 90* 246  MCV 77.5* 80.4  MCH 27.6 27.5  MCHC 35.6 34.3  RDW 12.4 12.8  LYMPHSABS 1.3  --   MONOABS 0.3  --   EOSABS 0.0  --   BASOSABS 0.0  --     Chemistries   Recent Labs Lab 08/23/15 1619 08/23/15 2217 08/24/15 0340  NA 123* 138 148*  K 6.2* 4.5 4.1  CL 86* 102 115*  CO2 17* 17* 21*  GLUCOSE 1395* 730* 244*  BUN 31* 28* 23*  CREATININE 2.41* 2.14* 1.68*  CALCIUM 9.6 9.9 9.8  MG  --  3.0*  --   AST 32  --  33  ALT 35  --  31  ALKPHOS 103  --  80  BILITOT 1.3*  --  1.0   ------------------------------------------------------------------------------------------------------------------ estimated creatinine clearance is 77.2 mL/min (by C-G formula based on Cr of  1.68). ------------------------------------------------------------------------------------------------------------------ No results for input(s): HGBA1C in the last 72 hours. ------------------------------------------------------------------------------------------------------------------ No results for input(s): CHOL, HDL, LDLCALC, TRIG, CHOLHDL, LDLDIRECT in the last 72 hours. ------------------------------------------------------------------------------------------------------------------ No results for input(s): TSH, T4TOTAL, T3FREE, THYROIDAB in the last 72 hours.  Invalid input(s): FREET3 ------------------------------------------------------------------------------------------------------------------ No results for input(s): VITAMINB12, FOLATE, FERRITIN, TIBC, IRON, RETICCTPCT in the last 72 hours.  Coagulation profile No results for input(s): INR, PROTIME in the last 168 hours.  No results for input(s): DDIMER in the last 72 hours.  Cardiac Enzymes No results for input(s): CKMB, TROPONINI, MYOGLOBIN in the last 168 hours.  Invalid input(s): CK ------------------------------------------------------------------------------------------------------------------ Invalid input(s): POCBNP   CBG:  Recent Labs Lab 08/24/15 0427 08/24/15 0523 08/24/15 0622 08/24/15 0718 08/24/15 0757  GLUCAP 235* 203* 216* 183* 179*       Studies: Dg Chest 2  View  08/23/2015  CLINICAL DATA:  Blurred vision, weakness and dizziness. EXAM: CHEST  2 VIEW COMPARISON:  None. FINDINGS: The cardiac silhouette, mediastinal and hilar contours are within normal limits. Day streaky density in the left lower lobe could reflect atelectasis or scar. No pleural effusion. The bony thorax is intact. IMPRESSION: Streaky left lower lobe atelectasis or scarring. No definite infiltrates, effusions or edema. Electronically Signed   By: Rudie Meyer M.D.   On: 08/23/2015 19:19      No results found for:  HGBA1C Lab Results  Component Value Date   CREATININE 1.68* 08/24/2015       Scheduled Meds: . heparin  5,000 Units Subcutaneous 3 times per day  . insulin aspart  0-15 Units Subcutaneous TID WC  . insulin aspart  5 Units Subcutaneous TID WC  . insulin glargine  40 Units Subcutaneous Daily  . insulin regular  0-10 Units Intravenous TID WC  . pantoprazole  40 mg Oral Daily  . sodium chloride  3 mL Intravenous Q12H   Continuous Infusions: . sodium chloride Stopped (08/24/15 0344)  . dextrose 5 % and 0.45% NaCl 150 mL/hr at 08/24/15 0708    Principal Problem:   Hyperglycemia Active Problems:   Weight loss   Asthma   Obesity   Hyperkalemia   AKI (acute kidney injury) (HCC)   Thrombocytopenia (HCC)   Nausea with vomiting    Time spent: 45 minutes   [facility]  [facility] Pager [phone]. If 7PM-7AM, please contact night-coverage at [email or web address], password [facility] 08/24/2015, 11:41 AM  LOS: 1 day

## 2015-08-25 LAB — COMPREHENSIVE METABOLIC PANEL
ALT: 30 U/L (ref 17–63)
AST: 44 U/L — AB (ref 15–41)
Albumin: 2.8 g/dL — ABNORMAL LOW (ref 3.5–5.0)
Alkaline Phosphatase: 64 U/L (ref 38–126)
Anion gap: 10 (ref 5–15)
BUN: 14 mg/dL (ref 6–20)
CHLORIDE: 103 mmol/L (ref 101–111)
CO2: 21 mmol/L — AB (ref 22–32)
Calcium: 8.3 mg/dL — ABNORMAL LOW (ref 8.9–10.3)
Creatinine, Ser: 1.49 mg/dL — ABNORMAL HIGH (ref 0.61–1.24)
GFR calc Af Amer: >60 mL/min (ref >60)
GFR, EST NON AFRICAN AMERICAN: 59 mL/min — AB (ref >60)
Glucose, Bld: 383 mg/dL — ABNORMAL HIGH (ref 65–99)
POTASSIUM: 3.3 mmol/L — AB (ref 3.5–5.1)
SODIUM: 134 mmol/L — AB (ref 135–145)
Total Bilirubin: 0.9 mg/dL (ref 0.3–1.2)
Total Protein: 6.6 g/dL (ref 6.5–8.1)

## 2015-08-25 LAB — CBC
HCT: 40.3 % (ref 39.0–52.0)
Hemoglobin: 13.4 g/dL (ref 13.0–17.0)
MCH: 27.1 pg (ref 26.0–34.0)
MCHC: 33.3 g/dL (ref 30.0–36.0)
MCV: 81.6 fL (ref 78.0–100.0)
PLATELETS: 169 10*3/uL (ref 150–400)
RBC: 4.94 MIL/uL (ref 4.22–5.81)
RDW: 12.8 % (ref 11.5–15.5)
WBC: 6.7 10*3/uL (ref 4.0–10.5)

## 2015-08-25 LAB — GLUCOSE, CAPILLARY
GLUCOSE-CAPILLARY: 342 mg/dL — AB (ref 65–99)
Glucose-Capillary: 392 mg/dL — ABNORMAL HIGH (ref 65–99)
Glucose-Capillary: 322 mg/dL — ABNORMAL HIGH (ref 65–99)

## 2015-08-25 LAB — LIPID PANEL
CHOL/HDL RATIO: 5.4 ratio
CHOLESTEROL: 233 mg/dL — AB (ref 0–200)
HDL: 43 mg/dL (ref >40)
LDL CALC: UNDETERMINED mg/dL (ref 0–99)
TRIGLYCERIDES: 425 mg/dL — AB (ref <150)
VLDL: UNDETERMINED mg/dL (ref 0–40)

## 2015-08-25 MED ORDER — ATORVASTATIN CALCIUM 40 MG PO TABS: 40.0000 mg | ORAL_TABLET | Freq: Every day | ORAL | Status: AC

## 2015-08-25 MED ORDER — PANTOPRAZOLE SODIUM 40 MG PO TBEC: 40.0000 mg | DELAYED_RELEASE_TABLET | Freq: Every day | ORAL | Status: AC

## 2015-08-25 MED ORDER — INSULIN ASPART 100 UNIT/ML ~~LOC~~ SOLN
10.0000 [IU] | Freq: Three times a day (TID) | SUBCUTANEOUS | Status: DC
  Administered 2015-08-25 (×2): 10 [IU] via SUBCUTANEOUS

## 2015-08-25 MED ORDER — INSULIN ASPART 100 UNIT/ML FLEXPEN: 7.0000 [IU] | PEN_INJECTOR | Freq: Three times a day (TID) | SUBCUTANEOUS | Status: AC

## 2015-08-25 MED ORDER — ATORVASTATIN CALCIUM 40 MG PO TABS
40.0000 mg | ORAL_TABLET | Freq: Every day | ORAL | Status: DC
  Administered 2015-08-25: 40 mg via ORAL
  Filled 2015-08-25: qty 1

## 2015-08-25 MED ORDER — POTASSIUM CHLORIDE IN NACL 40-0.9 MEQ/L-% IV SOLN
INTRAVENOUS | Status: DC
  Administered 2015-08-25: 75 mL/h via INTRAVENOUS
  Filled 2015-08-25 (×3): qty 1000

## 2015-08-25 MED ORDER — INSULIN GLARGINE 100 UNIT/ML ~~LOC~~ SOLN
55.0000 [IU] | Freq: Every day | SUBCUTANEOUS | Status: DC
  Administered 2015-08-25: 55 [IU] via SUBCUTANEOUS
  Filled 2015-08-25 (×2): qty 0.55

## 2015-08-25 MED ORDER — INSULIN GLARGINE 100 UNIT/ML SOLOSTAR PEN: 55.0000 [IU] | PEN_INJECTOR | Freq: Every day | SUBCUTANEOUS | Status: DC

## 2015-08-25 NOTE — Progress Notes (Signed)
08/25/15 Patient to go home after supper tonight, IV sites will be removed and discharge instructions to be reviewed with patient and family.

## 2015-08-25 NOTE — Care Management Note (Signed)
Case Management Note  Patient Details  Name: Nicholas Barrett MRN: [id number] Date of Birth: 1980-12-21  Subjective/Objective:                  Hyperglycemia Action/Plan: Discharge planning  Expected Discharge Date:  08/26/15               Expected Discharge Plan:  Home/Self Care  In-House Referral:     Discharge planning Services  CM Consult, Medication Assistance, [facility]  Post Acute Care Choice:    Choice offered to:     DME Arranged:    DME Agency:     HH Arranged:    HH Agency:     Status of Service:  Completed, signed off  Medicare Important Message Given:    Date Medicare IM Given:    Medicare IM give by:    Date Additional Medicare IM Given:    Additional Medicare Important Message give by:     If discussed at [place] of Stay Meetings, dates discussed:    Additional Comments: CM received consult for med asst and PCP resource.  CM met with pt and explained Estate agent to call for a PCP.  Unfortunately, pt has BCBS and is not eligible for [place].  No other CM needs were communicated. Dellie Catholic, RN 08/25/2015, 11:59 AM

## 2015-08-25 NOTE — Discharge Summary (Signed)
Physician Discharge Summary  Nicholas Barrett MRN: [id number] DOB/AGE: 35-19-1982 35 y.o.  PCP: No primary care provider on file.   Admit date: 08/23/2015 Discharge date: 08/25/2015  Discharge Diagnoses:   Principal Problem:   Hyperglycemia Active Problems:   Weight loss   Asthma   Obesity   Hyperkalemia   AKI (acute kidney injury) (HCC)   Thrombocytopenia (HCC)   Nausea with vomiting  hypertriglyceridemia    Follow-up recommendations Follow-up with PCP in 3-5 days , including all  additional recommended appointments as below Follow-up CBC, CMP in 3-5 days      Medication List    TAKE these medications        albuterol (2.5 MG/3ML) 0.083% nebulizer solution  Commonly known as:  PROVENTIL  Take 2.5 mg by nebulization every 6 (six) hours as needed for wheezing or shortness of breath.     albuterol 108 (90 BASE) MCG/ACT inhaler  Commonly known as:  PROVENTIL HFA;VENTOLIN HFA  Inhale 2 puffs into the lungs every 6 (six) hours as needed for wheezing or shortness of breath.     atorvastatin 40 MG tablet  Commonly known as:  LIPITOR  Take 1 tablet (40 mg total) by mouth daily at 6 PM.     insulin aspart 100 UNIT/ML FlexPen  Commonly known as:  NOVOLOG FLEXPEN  Inject 7 Units into the skin 3 (three) times daily with meals.     Insulin Glargine 100 UNIT/ML Solostar Pen  Commonly known as:  LANTUS  Inject 55 Units into the skin daily at 10 pm.     pantoprazole 40 MG tablet  Commonly known as:  PROTONIX  Take 1 tablet (40 mg total) by mouth daily.         Discharge Condition: Stable   Discharge Instructions     No Known Allergies    Disposition: Final discharge disposition not confirmed   Consults:  None     Significant Diagnostic Studies:  Dg Chest 2 View  08/23/2015  CLINICAL DATA:  Blurred vision, weakness and dizziness. EXAM: CHEST  2 VIEW COMPARISON:  None. FINDINGS: The cardiac silhouette, mediastinal and hilar contours are within normal  limits. Day streaky density in the left lower lobe could reflect atelectasis or scar. No pleural effusion. The bony thorax is intact. IMPRESSION: Streaky left lower lobe atelectasis or scarring. No definite infiltrates, effusions or edema. Electronically Signed   By: Marijo Sanes M.D.   On: 08/23/2015 19:19   US Renal  08/24/2015  CLINICAL DATA:  Acute kidney injury EXAM: RENAL / URINARY TRACT ULTRASOUND COMPLETE COMPARISON:  None. FINDINGS: Right Kidney: Length: 11.7 cm. Suboptimally visualized. No mass or hydronephrosis. Left Kidney: Length: 13.1 cm. Upper pole is suboptimally visualized. No mass or hydronephrosis. Bladder: Within normal limits. IMPRESSION: No hydronephrosis. Electronically Signed   By: Julian Hy M.D.   On: 08/24/2015 12:38        Filed Weights   08/24/15 0346 08/24/15 1719 08/25/15 0413  Weight: 105.824 kg (233 lb 4.8 oz) 110.133 kg (242 lb 12.8 oz) 110.8 kg (244 lb 4.3 oz)     Microbiology: Recent Results (from the past 240 hour(s))  MRSA PCR Screening     Status: None   Collection Time: 08/23/15  9:39 PM  Result Value Ref Range Status   MRSA by PCR NEGATIVE NEGATIVE Final    Comment:        The GeneXpert MRSA Assay (FDA approved for NASAL specimens only), is one component of a comprehensive MRSA colonization  surveillance program. It is not intended to diagnose MRSA infection nor to guide or monitor treatment for MRSA infections.        Blood Culture No results found for: SDES, Graham, CULT, REPTSTATUS    Labs: Results for orders placed or performed during the hospital encounter of 08/23/15 (from the past 48 hour(s))  Lipase, blood     Status: None   Collection Time: 08/23/15  4:19 PM  Result Value Ref Range   Lipase 42 11 - 51 U/L  Comprehensive metabolic panel     Status: Abnormal   Collection Time: 08/23/15  4:19 PM  Result Value Ref Range   Sodium 123 (L) 135 - 145 mmol/L   Potassium 6.2 (HH) 3.5 - 5.1 mmol/L    Comment:  REPEATED TO VERIFY CRITICAL RESULT CALLED TO, READ BACK BY AND VERIFIED WITH: C CHRISCOE,RN 1736 08/23/2015 WBOND    Chloride 86 (L) 101 - 111 mmol/L   CO2 17 (L) 22 - 32 mmol/L   Glucose, Bld 1395 (HH) 65 - 99 mg/dL    Comment: RESULTS CONFIRMED BY MANUAL DILUTION CRITICAL RESULT CALLED TO, READ BACK BY AND VERIFIED WITH: C CHRISCOE,RN 1736 08/23/2015 WBOND    BUN 31 (H) 6 - 20 mg/dL   Creatinine, Ser 2.41 (H) 0.61 - 1.24 mg/dL   Calcium 9.6 8.9 - 10.3 mg/dL   Total Protein 9.1 (H) 6.5 - 8.1 g/dL   Albumin 3.9 3.5 - 5.0 g/dL   AST 32 15 - 41 U/L   ALT 35 17 - 63 U/L   Alkaline Phosphatase 103 38 - 126 U/L   Total Bilirubin 1.3 (H) 0.3 - 1.2 mg/dL   GFR calc non Af Amer 33 (L) >60 mL/min   GFR calc Af Amer 38 (L) >60 mL/min    Comment: (NOTE) The eGFR has been calculated using the CKD EPI equation. This calculation has not been validated in all clinical situations. eGFR's persistently <60 mL/min signify possible Chronic Kidney Disease.    Anion gap 20 (H) 5 - 15  CBC with Differential     Status: Abnormal   Collection Time: 08/23/15  4:56 PM  Result Value Ref Range   WBC 6.9 4.0 - 10.5 K/uL   RBC 5.69 4.22 - 5.81 MIL/uL   Hemoglobin 15.7 13.0 - 17.0 g/dL   HCT 44.1 39.0 - 52.0 %   MCV 77.5 (L) 78.0 - 100.0 fL   MCH 27.6 26.0 - 34.0 pg   MCHC 35.6 30.0 - 36.0 g/dL   RDW 12.4 11.5 - 15.5 %   Platelets 90 (L) 150 - 400 K/uL    Comment: PLATELET COUNT CONFIRMED BY SMEAR   Neutrophils Relative % 77 %   Neutro Abs 5.4 1.7 - 7.7 K/uL   Lymphocytes Relative 19 %   Lymphs Abs 1.3 0.7 - 4.0 K/uL   Monocytes Relative 4 %   Monocytes Absolute 0.3 0.1 - 1.0 K/uL   Eosinophils Relative 0 %   Eosinophils Absolute 0.0 0.0 - 0.7 K/uL   Basophils Relative 0 %   Basophils Absolute 0.0 0.0 - 0.1 K/uL  I-Stat venous blood gas, ED     Status: Abnormal   Collection Time: 08/23/15  6:35 PM  Result Value Ref Range   pH, Ven 7.300 7.250 - 7.300   pCO2, Ven 38.4 (L) 45.0 - 50.0 mmHg    pO2, Ven 54.0 (H) 30.0 - 45.0 mmHg   Bicarbonate 18.9 (L) 20.0 - 24.0 mEq/L   TCO2 20 0 -  100 mmol/L   O2 Saturation 84.0 %   Acid-base deficit 7.0 (H) 0.0 - 2.0 mmol/L   Patient temperature HIDE    Sample type VENOUS   Urinalysis, Routine w reflex microscopic (not at [facility])     Status: Abnormal   Collection Time: 08/23/15  7:18 PM  Result Value Ref Range   Color, Urine YELLOW YELLOW   APPearance CLEAR CLEAR   Specific Gravity, Urine 1.035 (H) 1.005 - 1.030   pH 5.0 5.0 - 8.0   Glucose, UA >1000 (A) NEGATIVE mg/dL   Hgb urine dipstick TRACE (A) NEGATIVE   Bilirubin Urine NEGATIVE NEGATIVE   Ketones, ur 40 (A) NEGATIVE mg/dL   Protein, ur NEGATIVE NEGATIVE mg/dL   Urobilinogen, UA 0.2 0.0 - 1.0 mg/dL   Nitrite NEGATIVE NEGATIVE   Leukocytes, UA NEGATIVE NEGATIVE  Urine microscopic-add on     Status: None   Collection Time: 08/23/15  7:18 PM  Result Value Ref Range   Squamous Epithelial / LPF RARE RARE   WBC, UA 0-2 <3 WBC/hpf   RBC / HPF 0-2 <3 RBC/hpf  CBG monitoring, ED     Status: Abnormal   Collection Time: 08/23/15  7:43 PM  Result Value Ref Range   Glucose-Capillary >600 (HH) 65 - 99 mg/dL  CBG monitoring, ED     Status: Abnormal   Collection Time: 08/23/15  9:03 PM  Result Value Ref Range   Glucose-Capillary >600 (HH) 65 - 99 mg/dL  MRSA PCR Screening     Status: None   Collection Time: 08/23/15  9:39 PM  Result Value Ref Range   MRSA by PCR NEGATIVE NEGATIVE    Comment:        The GeneXpert MRSA Assay (FDA approved for NASAL specimens only), is one component of a comprehensive MRSA colonization surveillance program. It is not intended to diagnose MRSA infection nor to guide or monitor treatment for MRSA infections.   Glucose, capillary     Status: Abnormal   Collection Time: 08/23/15 10:15 PM  Result Value Ref Range   Glucose-Capillary >600 (HH) 65 - 99 mg/dL  Basic metabolic panel     Status: Abnormal   Collection Time: 08/23/15 10:17 PM  Result Value  Ref Range   Sodium 138 135 - 145 mmol/L    Comment: DELTA CHECK NOTED   Potassium 4.5 3.5 - 5.1 mmol/L    Comment: DELTA CHECK NOTED   Chloride 102 101 - 111 mmol/L   CO2 17 (L) 22 - 32 mmol/L   Glucose, Bld 730 (HH) 65 - 99 mg/dL    Comment: CRITICAL RESULT CALLED TO, READ BACK BY AND VERIFIED WITH: ENNIS M,RN 08/23/15 2317 WAYK    BUN 28 (H) 6 - 20 mg/dL   Creatinine, Ser 2.14 (H) 0.61 - 1.24 mg/dL   Calcium 9.9 8.9 - 10.3 mg/dL   GFR calc non Af Amer 38 (L) >60 mL/min   GFR calc Af Amer 44 (L) >60 mL/min    Comment: (NOTE) The eGFR has been calculated using the CKD EPI equation. This calculation has not been validated in all clinical situations. eGFR's persistently <60 mL/min signify possible Chronic Kidney Disease.    Anion gap 19 (H) 5 - 15  Magnesium     Status: Abnormal   Collection Time: 08/23/15 10:17 PM  Result Value Ref Range   Magnesium 3.0 (H) 1.7 - 2.4 mg/dL  Phosphorus     Status: None   Collection Time: 08/23/15 10:17 PM  Result  Value Ref Range   Phosphorus 3.7 2.5 - 4.6 mg/dL  Beta-hydroxybutyric acid     Status: Abnormal   Collection Time: 08/23/15 10:17 PM  Result Value Ref Range   Beta-Hydroxybutyric Acid 4.90 (H) 0.05 - 0.27 mmol/L    Comment: RESULTS CONFIRMED BY MANUAL DILUTION  Glucose, capillary     Status: Abnormal   Collection Time: 08/23/15 11:15 PM  Result Value Ref Range   Glucose-Capillary 453 (H) 65 - 99 mg/dL  Glucose, capillary     Status: Abnormal   Collection Time: 08/24/15 12:20 AM  Result Value Ref Range   Glucose-Capillary 439 (H) 65 - 99 mg/dL  Glucose, capillary     Status: Abnormal   Collection Time: 08/24/15  1:28 AM  Result Value Ref Range   Glucose-Capillary 350 (H) 65 - 99 mg/dL  Glucose, capillary     Status: Abnormal   Collection Time: 08/24/15  2:35 AM  Result Value Ref Range   Glucose-Capillary 266 (H) 65 - 99 mg/dL  CBC     Status: None   Collection Time: 08/24/15  3:40 AM  Result Value Ref Range   WBC 7.9 4.0 -  10.5 K/uL   RBC 5.81 4.22 - 5.81 MIL/uL   Hemoglobin 16.0 13.0 - 17.0 g/dL   HCT 46.7 39.0 - 52.0 %   MCV 80.4 78.0 - 100.0 fL   MCH 27.5 26.0 - 34.0 pg   MCHC 34.3 30.0 - 36.0 g/dL   RDW 12.8 11.5 - 15.5 %   Platelets 246 150 - 400 K/uL    Comment: DELTA CHECK NOTED REPEATED TO VERIFY   Comprehensive metabolic panel     Status: Abnormal   Collection Time: 08/24/15  3:40 AM  Result Value Ref Range   Sodium 148 (H) 135 - 145 mmol/L    Comment: DELTA CHECK NOTED   Potassium 4.1 3.5 - 5.1 mmol/L   Chloride 115 (H) 101 - 111 mmol/L   CO2 21 (L) 22 - 32 mmol/L   Glucose, Bld 244 (H) 65 - 99 mg/dL   BUN 23 (H) 6 - 20 mg/dL   Creatinine, Ser 1.68 (H) 0.61 - 1.24 mg/dL   Calcium 9.8 8.9 - 10.3 mg/dL   Total Protein 8.7 (H) 6.5 - 8.1 g/dL   Albumin 3.8 3.5 - 5.0 g/dL   AST 33 15 - 41 U/L   ALT 31 17 - 63 U/L   Alkaline Phosphatase 80 38 - 126 U/L   Total Bilirubin 1.0 0.3 - 1.2 mg/dL   GFR calc non Af Amer 51 (L) >60 mL/min   GFR calc Af Amer 59 (L) >60 mL/min    Comment: (NOTE) The eGFR has been calculated using the CKD EPI equation. This calculation has not been validated in all clinical situations. eGFR's persistently <60 mL/min signify possible Chronic Kidney Disease.    Anion gap 12 5 - 15  Glucose, capillary     Status: Abnormal   Collection Time: 08/24/15  3:42 AM  Result Value Ref Range   Glucose-Capillary 221 (H) 65 - 99 mg/dL  Glucose, capillary     Status: Abnormal   Collection Time: 08/24/15  4:27 AM  Result Value Ref Range   Glucose-Capillary 235 (H) 65 - 99 mg/dL  Glucose, capillary     Status: Abnormal   Collection Time: 08/24/15  5:23 AM  Result Value Ref Range   Glucose-Capillary 203 (H) 65 - 99 mg/dL  Glucose, capillary     Status: Abnormal  Collection Time: 08/24/15  6:22 AM  Result Value Ref Range   Glucose-Capillary 216 (H) 65 - 99 mg/dL  Glucose, capillary     Status: Abnormal   Collection Time: 08/24/15  7:18 AM  Result Value Ref Range    Glucose-Capillary 183 (H) 65 - 99 mg/dL  Glucose, capillary     Status: Abnormal   Collection Time: 08/24/15  7:57 AM  Result Value Ref Range   Glucose-Capillary 179 (H) 65 - 99 mg/dL  Glucose, capillary     Status: Abnormal   Collection Time: 08/24/15  9:57 AM  Result Value Ref Range   Glucose-Capillary 204 (H) 65 - 99 mg/dL  Glucose, capillary     Status: Abnormal   Collection Time: 08/24/15 11:18 AM  Result Value Ref Range   Glucose-Capillary 189 (H) 65 - 99 mg/dL  Glucose, capillary     Status: Abnormal   Collection Time: 08/24/15 12:48 PM  Result Value Ref Range   Glucose-Capillary 165 (H) 65 - 99 mg/dL  Sodium, urine, random     Status: None   Collection Time: 08/24/15  2:28 PM  Result Value Ref Range   Sodium, Ur 23 mmol/L  Creatinine, urine, random     Status: None   Collection Time: 08/24/15  2:28 PM  Result Value Ref Range   Creatinine, Urine 245.61 mg/dL  Glucose, capillary     Status: Abnormal   Collection Time: 08/24/15  3:25 PM  Result Value Ref Range   Glucose-Capillary 304 (H) 65 - 99 mg/dL  Glucose, capillary     Status: Abnormal   Collection Time: 08/24/15  5:24 PM  Result Value Ref Range   Glucose-Capillary 299 (H) 65 - 99 mg/dL  Glucose, capillary     Status: Abnormal   Collection Time: 08/24/15  9:49 PM  Result Value Ref Range   Glucose-Capillary 325 (H) 65 - 99 mg/dL  CBC     Status: None   Collection Time: 08/25/15  5:11 AM  Result Value Ref Range   WBC 6.7 4.0 - 10.5 K/uL   RBC 4.94 4.22 - 5.81 MIL/uL   Hemoglobin 13.4 13.0 - 17.0 g/dL   HCT 40.3 39.0 - 52.0 %   MCV 81.6 78.0 - 100.0 fL   MCH 27.1 26.0 - 34.0 pg   MCHC 33.3 30.0 - 36.0 g/dL   RDW 12.8 11.5 - 15.5 %   Platelets 169 150 - 400 K/uL  Comprehensive metabolic panel     Status: Abnormal   Collection Time: 08/25/15  5:11 AM  Result Value Ref Range   Sodium 134 (L) 135 - 145 mmol/L    Comment: DELTA CHECK NOTED   Potassium 3.3 (L) 3.5 - 5.1 mmol/L    Comment: DELTA CHECK NOTED    Chloride 103 101 - 111 mmol/L   CO2 21 (L) 22 - 32 mmol/L   Glucose, Bld 383 (H) 65 - 99 mg/dL   BUN 14 6 - 20 mg/dL   Creatinine, Ser 1.49 (H) 0.61 - 1.24 mg/dL   Calcium 8.3 (L) 8.9 - 10.3 mg/dL   Total Protein 6.6 6.5 - 8.1 g/dL   Albumin 2.8 (L) 3.5 - 5.0 g/dL   AST 44 (H) 15 - 41 U/L   ALT 30 17 - 63 U/L   Alkaline Phosphatase 64 38 - 126 U/L   Total Bilirubin 0.9 0.3 - 1.2 mg/dL   GFR calc non Af Amer 59 (L) >60 mL/min   GFR calc Af Amer >60 >60  mL/min    Comment: (NOTE) The eGFR has been calculated using the CKD EPI equation. This calculation has not been validated in all clinical situations. eGFR's persistently <60 mL/min signify possible Chronic Kidney Disease.    Anion gap 10 5 - 15  Lipid panel     Status: Abnormal   Collection Time: 08/25/15  5:11 AM  Result Value Ref Range   Cholesterol 233 (H) 0 - 200 mg/dL   Triglycerides 425 (H) <150 mg/dL   HDL 43 >40 mg/dL   Total CHOL/HDL Ratio 5.4 RATIO   VLDL UNABLE TO CALCULATE IF TRIGLYCERIDE OVER 400 mg/dL 0 - 40 mg/dL   LDL Cholesterol UNABLE TO CALCULATE IF TRIGLYCERIDE OVER 400 mg/dL 0 - 99 mg/dL    Comment:        Total Cholesterol/HDL:CHD Risk Coronary Heart Disease Risk Table                     Men   Women  1/2 Average Risk   3.4   3.3  Average Risk       5.0   4.4  2 X Average Risk   9.6   7.1  3 X Average Risk  23.4   11.0        Use the calculated Patient Ratio above and the CHD Risk Table to determine the patient's CHD Risk.        ATP III CLASSIFICATION (LDL):  <100     mg/dL   Optimal  100-129  mg/dL   Near or Above                    Optimal  130-159  mg/dL   Borderline  160-189  mg/dL   High  >190     mg/dL   Very High   Glucose, capillary     Status: Abnormal   Collection Time: 08/25/15  7:41 AM  Result Value Ref Range   Glucose-Capillary 392 (H) 65 - 99 mg/dL     Lipid Panel     Component Value Date/Time   CHOL 233* 08/25/2015 0511   TRIG 425* 08/25/2015 0511   HDL 43 08/25/2015  0511   CHOLHDL 5.4 08/25/2015 0511   VLDL UNABLE TO CALCULATE IF TRIGLYCERIDE OVER 400 mg/dL 08/25/2015 0511   LDLCALC UNABLE TO CALCULATE IF TRIGLYCERIDE OVER 400 mg/dL 08/25/2015 0511     No results found for: HGBA1C   Lab Results  Component Value Date   LDLCALC UNABLE TO CALCULATE IF TRIGLYCERIDE OVER 400 mg/dL 08/25/2015   CREATININE 1.49* 08/25/2015     HPI 34 y/o male with a past medical history significant for obesity, asthma, and GERD; who presents with nausea, vomiting, and abdominal pain. Symptoms initially started a week or so ago with just generalized malaise. Then approximately 5 days ago patient noted onset of generalized crampy like abdominal pain that would come and go. In the last 3 days he noted new symptoms of blurry vision. Patient is nearsighted and wears glasses at baseline. Associated symptoms include excessive thirst, frequency of urination, numbness tingling sensations intermittently. He denies any shortness of breath or chest pain symptoms. In the last 2 weeks he went from weighing 266 pounds down to 236 pounds and family had begun to notice that he lost his belly. Patient denied working out lose the weight or any dietary changes. He also notes that his mom and dad both have diabetes. Patient notes today he continued to feel somewhat nauseous and  was determined to vomit as he describes it feels like food just sits on his stomach. Determined to vomit proceeded to [employer] and got a meal with milkshake and vomited twice thereafter. Still not feeling well and he decided it was time to come to the emergency department. Once he was admitted into the emergency department blood glucose level was seen to be 1395.  Mr. Valley also brings up the fact that he has a history of asthma that he has not had an inhaler for some time. He reports the last time he was hospitalized for a asthma exacerbation was some time in June prior to the moving from [place] down to [place].  Patient also notes that the last time he had a primary care doctor was approximately a year ago.  HOSPITAL COURSE:   Mild diabetic ketoacidosis Started on DKA protocol overnight, anion gap is 20 upon admission, CBG 1395 CBG is improved, anion gap 10 on the day of discharge Patient transition to Lantus flex and, NovoLog pen, Will be discharged on 55 units of Lantus and 7 units of NovoLog pre-meal Diabetes coordinator consult still pending Triglycerides 425  Hypertriglyceridemia-triglycerides were 25 the patient has been started on Lipitor  Acute kidney injury Creatinine improving to 2.14> 1.49 likely secondary to dehydration   Asthma-stable, continue when necessary albuterol  Hyperkalemia resolved , now hypokalemic  Morbid obesity Body mass index is 31.63 kg/(m^2). Nutrition consult  Thrombocytopenia-resolved   Discharge Exam:    Blood pressure 102/70, pulse 93, temperature 98 F (36.7 C), temperature source Oral, resp. rate 18, height 6' (1.829 m), weight 110.8 kg (244 lb 4.3 oz), SpO2 97 %.   Cardiovascular: Slightly tachycardic, S1 normal, S2 normal, no MRG, pulses symmetric and intact bilaterally  Pulmonary/Chest: CTAB, no wheezes, rales, or rhonchi  Abdominal: Soft. Non-tender, non-distended, bowel sounds are normal, no masses, organomegaly, or guarding present.  GU: no CVA tenderness Musculoskeletal: No joint deformities, erythema, or stiffness, ROM full and no nontender Ext: no edema and no cyanosis, pulses palpable bilaterally (DP and PT)  Hematology: no cervical, inginal, or axillary adenopathy.  Neurological: A&O x3, Strenght is normal and symmetric bilaterally, cranial nerve II-XII are grossly intact, no focal motor deficit, sensory intact to light touch bilaterally.        Follow-up Information    Follow up with PCP. Schedule an appointment as soon as possible for a visit in 3 days.      SignedReyne Dumas 08/25/2015, 10:28 AM        Time  spent >45 mins

## 2015-08-29 ENCOUNTER — Inpatient Hospital Stay (HOSPITAL_COMMUNITY)
Admission: EM | Admit: 2015-08-29 | Discharge: 2015-08-31 | DRG: 638 | Disposition: A | Discharge status: Home / Self Care | Payer: BLUE CROSS/BLUE SHIELD | Attending: Internal Medicine | Admitting: Internal Medicine

## 2015-08-29 ENCOUNTER — Encounter (HOSPITAL_COMMUNITY): Payer: Self-pay | Admitting: Emergency Medicine

## 2015-08-29 ENCOUNTER — Inpatient Hospital Stay (HOSPITAL_COMMUNITY): Payer: BLUE CROSS/BLUE SHIELD

## 2015-08-29 DIAGNOSIS — Z9114 Patient's other noncompliance with medication regimen: Secondary | ICD-10-CM

## 2015-08-29 DIAGNOSIS — N179 Acute kidney failure, unspecified: Secondary | ICD-10-CM

## 2015-08-29 DIAGNOSIS — E1310 Other specified diabetes mellitus with ketoacidosis without coma: Secondary | ICD-10-CM

## 2015-08-29 DIAGNOSIS — E785 Hyperlipidemia, unspecified: Secondary | ICD-10-CM | POA: Diagnosis present

## 2015-08-29 DIAGNOSIS — Z794 Long term (current) use of insulin: Secondary | ICD-10-CM | POA: Diagnosis not present

## 2015-08-29 DIAGNOSIS — K219 Gastro-esophageal reflux disease without esophagitis: Secondary | ICD-10-CM | POA: Diagnosis present

## 2015-08-29 DIAGNOSIS — N183 Chronic kidney disease, stage 3 unspecified: Secondary | ICD-10-CM

## 2015-08-29 DIAGNOSIS — J4520 Mild intermittent asthma, uncomplicated: Secondary | ICD-10-CM

## 2015-08-29 DIAGNOSIS — E860 Dehydration: Secondary | ICD-10-CM | POA: Diagnosis present

## 2015-08-29 DIAGNOSIS — E1165 Type 2 diabetes mellitus with hyperglycemia: Principal | ICD-10-CM | POA: Diagnosis present

## 2015-08-29 DIAGNOSIS — E0810 Diabetes mellitus due to underlying condition with ketoacidosis without coma: Secondary | ICD-10-CM | POA: Diagnosis not present

## 2015-08-29 DIAGNOSIS — J45909 Unspecified asthma, uncomplicated: Secondary | ICD-10-CM | POA: Diagnosis present

## 2015-08-29 DIAGNOSIS — E1122 Type 2 diabetes mellitus with diabetic chronic kidney disease: Secondary | ICD-10-CM | POA: Diagnosis present

## 2015-08-29 DIAGNOSIS — Z6832 Body mass index (BMI) 32.0-32.9, adult: Secondary | ICD-10-CM

## 2015-08-29 DIAGNOSIS — E669 Obesity, unspecified: Secondary | ICD-10-CM | POA: Diagnosis present

## 2015-08-29 DIAGNOSIS — E1110 Type 2 diabetes mellitus with ketoacidosis without coma: Secondary | ICD-10-CM | POA: Diagnosis present

## 2015-08-29 HISTORY — DX: Type 2 diabetes mellitus with diabetic chronic kidney disease: E11.22

## 2015-08-29 HISTORY — DX: Chronic kidney disease, stage 3 unspecified: N18.30

## 2015-08-29 HISTORY — DX: Chronic kidney disease, stage 3 (moderate): N18.3

## 2015-08-29 LAB — CBC
HCT: 45.8 % (ref 39.0–52.0)
Hemoglobin: 15.4 g/dL (ref 13.0–17.0)
MCH: 27.5 pg (ref 26.0–34.0)
MCHC: 33.6 g/dL (ref 30.0–36.0)
MCV: 81.6 fL (ref 78.0–100.0)
Platelets: 266 K/uL (ref 150–400)
RBC: 5.61 MIL/uL (ref 4.22–5.81)
RDW: 12.5 % (ref 11.5–15.5)
WBC: 9.5 K/uL (ref 4.0–10.5)

## 2015-08-29 LAB — URINE MICROSCOPIC-ADD ON

## 2015-08-29 LAB — BASIC METABOLIC PANEL WITH GFR
Anion gap: 19 — ABNORMAL HIGH (ref 5–15)
BUN: 28 mg/dL — ABNORMAL HIGH (ref 6–20)
CO2: 17 mmol/L — ABNORMAL LOW (ref 22–32)
Calcium: 9.7 mg/dL (ref 8.9–10.3)
Chloride: 95 mmol/L — ABNORMAL LOW (ref 101–111)
Creatinine, Ser: 1.72 mg/dL — ABNORMAL HIGH (ref 0.61–1.24)
GFR calc Af Amer: 58 mL/min — ABNORMAL LOW
GFR calc non Af Amer: 50 mL/min — ABNORMAL LOW
Glucose, Bld: 692 mg/dL (ref 65–99)
Potassium: 4.5 mmol/L (ref 3.5–5.1)
Sodium: 131 mmol/L — ABNORMAL LOW (ref 135–145)

## 2015-08-29 LAB — URINALYSIS, ROUTINE W REFLEX MICROSCOPIC
Bilirubin Urine: NEGATIVE
Glucose, UA: >1000 mg/dL — AB
Ketones, ur: 40 mg/dL — AB
Nitrite: NEGATIVE
Protein, ur: NEGATIVE mg/dL
Specific Gravity, Urine: 1.034 — ABNORMAL HIGH (ref 1.005–1.030)
pH: 5 (ref 5.0–8.0)

## 2015-08-29 LAB — CBG MONITORING, ED
GLUCOSE-CAPILLARY: 466 mg/dL — AB (ref 65–99)
GLUCOSE-CAPILLARY: 564 mg/dL — AB (ref 65–99)

## 2015-08-29 MED ORDER — SODIUM CHLORIDE 0.9 % IV SOLN
INTRAVENOUS | Status: DC
  Administered 2015-08-29: 5 [IU]/h via INTRAVENOUS
  Administered 2015-08-30: 3.6 [IU]/h via INTRAVENOUS
  Administered 2015-08-30: 6.5 [IU]/h via INTRAVENOUS
  Administered 2015-08-30: 3.3 [IU]/h via INTRAVENOUS
  Filled 2015-08-29: qty 2.5

## 2015-08-29 MED ORDER — INSULIN REGULAR HUMAN 100 UNIT/ML IJ SOLN
INTRAMUSCULAR | Status: DC
  Administered 2015-08-29: 5 [IU]/h via INTRAVENOUS
  Filled 2015-08-29: qty 2.5

## 2015-08-29 MED ORDER — DEXTROSE-NACL 5-0.45 % IV SOLN: INTRAVENOUS | Status: DC

## 2015-08-29 MED ORDER — SODIUM CHLORIDE 0.9 % IV BOLUS (SEPSIS)
2000.0000 mL | Freq: Once | INTRAVENOUS | Status: AC
  Administered 2015-08-30: 2000 mL via INTRAVENOUS

## 2015-08-29 MED ORDER — PANTOPRAZOLE SODIUM 40 MG PO TBEC
40.0000 mg | DELAYED_RELEASE_TABLET | Freq: Every day | ORAL | Status: DC
  Administered 2015-08-30 – 2015-08-31 (×2): 40 mg via ORAL
  Filled 2015-08-29 (×2): qty 1

## 2015-08-29 MED ORDER — POTASSIUM CHLORIDE 10 MEQ/100ML IV SOLN: 10.0000 meq | INTRAVENOUS | Status: AC

## 2015-08-29 MED ORDER — DEXTROSE-NACL 5-0.45 % IV SOLN
INTRAVENOUS | Status: DC
  Administered 2015-08-30: 04:00:00 via INTRAVENOUS

## 2015-08-29 MED ORDER — HEPARIN SODIUM (PORCINE) 5000 UNIT/ML IJ SOLN
5000.0000 [IU] | Freq: Three times a day (TID) | INTRAMUSCULAR | Status: DC
  Administered 2015-08-30 – 2015-08-31 (×4): 5000 [IU] via SUBCUTANEOUS
  Filled 2015-08-29 (×4): qty 1

## 2015-08-29 MED ORDER — ATORVASTATIN CALCIUM 40 MG PO TABS
40.0000 mg | ORAL_TABLET | Freq: Every day | ORAL | Status: DC
  Administered 2015-08-30: 40 mg via ORAL
  Filled 2015-08-29: qty 1

## 2015-08-29 MED ORDER — ALBUTEROL SULFATE (2.5 MG/3ML) 0.083% IN NEBU: 2.5000 mg | INHALATION_SOLUTION | Freq: Four times a day (QID) | RESPIRATORY_TRACT | Status: DC | PRN

## 2015-08-29 MED ORDER — SODIUM CHLORIDE 0.9 % IV SOLN
INTRAVENOUS | Status: DC
  Administered 2015-08-30: 02:00:00 via INTRAVENOUS

## 2015-08-29 MED ORDER — SODIUM CHLORIDE 0.9 % IV SOLN
1000.0000 mL | Freq: Once | INTRAVENOUS | Status: AC
  Administered 2015-08-29: 1000 mL via INTRAVENOUS

## 2015-08-29 MED ORDER — ONDANSETRON HCL 4 MG/2ML IJ SOLN: 4.0000 mg | Freq: Three times a day (TID) | INTRAMUSCULAR | Status: DC | PRN

## 2015-08-29 MED ORDER — SODIUM CHLORIDE 0.9 % IV SOLN
1000.0000 mL | INTRAVENOUS | Status: DC
  Administered 2015-08-29: 1000 mL via INTRAVENOUS

## 2015-08-29 NOTE — ED Provider Notes (Signed)
CSN: [id number]     Arrival date & time 08/29/15  1912 History   First MD Initiated Contact with Patient 08/29/15 2116     Chief Complaint  Patient presents with  . Hyperglycemia     (Consider location/radiation/quality/duration/timing/severity/associated sxs/prior Treatment) HPI Nicholas Barrett is a 34 y.o. male with history of asthma, obesity and new onset diabetes comes in for evaluation of hyperglycemia. Patient reports she is recently hospitalized last week for new onset diabetes. Reports he was discharged, but has not been able to afford his insulin medications. Reports over the past 2 or 3 days he has had increasing abdominal discomfort, urinary frequency, thirst. Denies any fevers, chills, nausea or vomiting, confusion. Family at bedside reports patient is at baseline and denies any altered mentation. Nothing makes the problem better or worse. No other aggravating or modifying factors.  Past Medical History  Diagnosis Date  . Asthma   . Obesity    Past Surgical History  Procedure Laterality Date  . Cholecystectomy     Family History  Problem Relation Age of Onset  . Diabetes Mother   . Diabetes Father    Social History  Substance Use Topics  . Smoking status: Never Smoker   . Smokeless tobacco: None  . Alcohol Use: No    Review of Systems A 10 point review of systems was completed and was negative except for pertinent positives and negatives as mentioned in the history of present illness     Allergies  Review of patient's allergies indicates no known allergies.  Home Medications   Prior to Admission medications   Medication Sig Start Date End Date Taking? Authorizing Provider  albuterol (PROVENTIL HFA;VENTOLIN HFA) 108 (90 BASE) MCG/ACT inhaler Inhale 2 puffs into the lungs every 6 (six) hours as needed for wheezing or shortness of breath.   Yes Historical Provider, MD  albuterol (PROVENTIL) (2.5 MG/3ML) 0.083% nebulizer solution Take 2.5 mg by nebulization every 6  (six) hours as needed for wheezing or shortness of breath.   Yes Historical Provider, MD  atorvastatin (LIPITOR) 40 MG tablet Take 1 tablet (40 mg total) by mouth daily at 6 PM. 08/25/15  Yes Richarda OverlieNayana Abrol, MD  insulin aspart (NOVOLOG FLEXPEN) 100 UNIT/ML FlexPen Inject 7 Units into the skin 3 (three) times daily with meals. 08/25/15  Yes Richarda OverlieNayana Abrol, MD  Insulin Glargine (LANTUS) 100 UNIT/ML Solostar Pen Inject 55 Units into the skin daily at 10 pm. 08/25/15  Yes Richarda OverlieNayana Abrol, MD  pantoprazole (PROTONIX) 40 MG tablet Take 1 tablet (40 mg total) by mouth daily. 08/25/15  Yes Richarda OverlieNayana Abrol, MD   BP 129/88 mmHg  Pulse 88  Temp(Src) 98.4 F (36.9 C) (Oral)  Resp 15  SpO2 97% Physical Exam  Constitutional: He is oriented to person, place, and time. He appears well-developed and well-nourished.  HENT:  Head: Normocephalic and atraumatic.  Dry mucous membranes  Eyes: Conjunctivae are normal. Pupils are equal, round, and reactive to light. Right eye exhibits no discharge. Left eye exhibits no discharge. No scleral icterus.  Neck: Neck supple.  Cardiovascular: Normal rate, regular rhythm and normal heart sounds.   Pulmonary/Chest: Effort normal and breath sounds normal. No respiratory distress. He has no wheezes. He has no rales.  Abdominal: Soft. There is no tenderness.  Musculoskeletal: He exhibits no tenderness.  Neurological: He is alert and oriented to person, place, and time.  Cranial Nerves II-XII grossly intact  Skin: Skin is warm and dry. No rash noted.  Psychiatric: He has a  normal mood and affect.  Nursing note and vitals reviewed.   ED Course  Procedures (including critical care time) Labs Review Labs Reviewed  BASIC METABOLIC PANEL - Abnormal; Notable for the following:    Sodium 131 (*)    Chloride 95 (*)    CO2 17 (*)    Glucose, Bld 692 (*)    BUN 28 (*)    Creatinine, Ser 1.72 (*)    GFR calc non Af Amer 50 (*)    GFR calc Af Amer 58 (*)    Anion gap 19 (*)    All  other components within normal limits  URINALYSIS, ROUTINE W REFLEX MICROSCOPIC (NOT AT [facility]) - Abnormal; Notable for the following:    Specific Gravity, Urine 1.034 (*)    Glucose, UA >1000 (*)    Hgb urine dipstick SMALL (*)    Ketones, ur 40 (*)    Leukocytes, UA SMALL (*)    All other components within normal limits  URINE MICROSCOPIC-ADD ON - Abnormal; Notable for the following:    Squamous Epithelial / LPF 0-5 (*)    Bacteria, UA RARE (*)    All other components within normal limits  CBG MONITORING, ED - Abnormal; Notable for the following:    Glucose-Capillary >600 (*)    All other components within normal limits  CBC    Imaging Review No results found. I have personally reviewed and evaluated these images and lab results as part of my medical decision-making.   EKG Interpretation None     Meds given in ED:  Medications  insulin regular (NOVOLIN R,HUMULIN R) 250 Units in sodium chloride 0.9 % 250 mL (1 Units/mL) infusion (not administered)  dextrose 5 %-0.45 % sodium chloride infusion (not administered)  0.9 %  sodium chloride infusion (not administered)    Followed by  0.9 %  sodium chloride infusion (not administered)    New Prescriptions   No medications on file   Filed Vitals:   08/29/15 1934 08/29/15 2119 08/29/15 2120 08/29/15 2125  BP: 120/100  129/88   Pulse: 118   88  Temp: 98.4 F (36.9 C)     TempSrc: Oral     Resp: 15     SpO2: 98% 97%  97%    MDM  Nicholas Barrett is a 34 y.o. male with history of asthma and obesity and newly diagnosed diabetes comes in for evaluation of hyperglycemia. Patient has not been able to obtain insulin prescription due to financial constraints. Abdominal discomfort, polydipsia, polyuria worsening over the past week. On arrival, patient appears dry, is hemodynamically stable with normal vital signs and is afebrile.  Glucose on arrival is 692, potassium 4.5, anion gap is 19, bicarbonate is 17. Patient started on  glucostabilizer. I believe the patient would benefit from medical admission for further evaluation and management of hyperglycemia as well as possible social work consult for securing outpatient insulin/diabetic medications.  Discussed with hospitalist, Dr. Clyde Lundborg, patient admitted to telemetry bed on medical service.  Final diagnoses:  Diabetic ketoacidosis without coma associated with other specified diabetes mellitus [facility])       Joycie Peek, PA-C 08/29/15 2156  Rolland Porter, MD 09/05/15 [phone]

## 2015-08-29 NOTE — ED Notes (Signed)
Pt. reported that he has not taken his insulin for 5 days , pt. stated he can not afford his insulin requesting adjustment on his insulin  prescription . Denies pain , respirations unlabored , no fever or chills.

## 2015-08-29 NOTE — H&P (Signed)
[facility] History and Physical  Nicholas CrapeGary Barrett ZOX:[id number] DOB: December 10, 1980 DOA: 08/29/2015  Referring physician: ED physician PCP: No primary care provider on file.  Specialists:   Chief Complaint: Generalized weakness, increasing urinary frequency.   HPI: Nicholas CrapeGary Saye is a 34 y.o. male with PMH of recently diagnosed diabetes mellitus-II, hyperlipidemia, asthma, GERD, obesity, CKD-IV, who presents with generalized weakness and increased urinary frequency.  Patient was recently hospitalized from 11/11-11/13 because of newly diagnosed diabetes mellitus and DKA. Patient was discharged on Lantus 55 units and NovoLog pre-meals. He states that he could not afford his insulin. He tried to schedule an appointment to establish care with PCP, however he was able to get an appointment until 09/27/15. He has not taken insulin in the past 5 days. He developed generalized weakness and increased urinary frequency. He denies dysuria or burning on urination, nausea, vomiting, diarrhea. He has very mild abdominal pain, no fever, chills, chest pain, shortness of breath, cough, unilateral weakness.  In ED, patient was found to have DKA with AG 19, potassium of 4.5, bicarbonate 17. Urinalysis positive for ketone and small amount of leukocytes. WBC 9.5, temperature normal, tachycardia, worsening renal function. The patient's admitted to inpatient for further eval and treatment.  Where does patient live?   At home   Can patient participate in ADLs?  Yes  Review of Systems:   General: no fevers, chills, no changes in body weight, has poor appetite, has fatigue HEENT: no blurry vision, hearing changes or sore throat Pulm: no dyspnea, coughing, wheezing CV: no chest pain, palpitations Abd: no nausea, vomiting, mild abdominal pain, no diarrhea, constipation GU: no dysuria, burning on urination, has increased urinary frequency, no hematuria  Ext: no leg edema Neuro: no unilateral weakness, numbness, or tingling,  no vision change or hearing loss Skin: no rash MSK: No muscle spasm, no deformity, no limitation of range of movement in spin Heme: No easy bruising.  Travel history: No recent long distant travel.  Allergy: No Known Allergies  Past Medical History  Diagnosis Date  . Asthma   . Obesity   . Type 2 diabetes mellitus with stage 3 chronic kidney disease (HCC)   . CKD (chronic kidney disease), stage III     Past Surgical History  Procedure Laterality Date  . Cholecystectomy      Social History:  reports that he has never smoked. He does not have any smokeless tobacco history on file. He reports that he does not drink alcohol or use illicit drugs.  Family History:  Family History  Problem Relation Age of Onset  . Diabetes Mother   . Diabetes Father      Prior to Admission medications   Medication Sig Start Date End Date Taking? Authorizing Provider  albuterol (PROVENTIL HFA;VENTOLIN HFA) 108 (90 BASE) MCG/ACT inhaler Inhale 2 puffs into the lungs every 6 (six) hours as needed for wheezing or shortness of breath.   Yes Historical Provider, MD  albuterol (PROVENTIL) (2.5 MG/3ML) 0.083% nebulizer solution Take 2.5 mg by nebulization every 6 (six) hours as needed for wheezing or shortness of breath.   Yes Historical Provider, MD  atorvastatin (LIPITOR) 40 MG tablet Take 1 tablet (40 mg total) by mouth daily at 6 PM. 08/25/15  Yes Richarda OverlieNayana Abrol, MD  insulin aspart (NOVOLOG FLEXPEN) 100 UNIT/ML FlexPen Inject 7 Units into the skin 3 (three) times daily with meals. 08/25/15  Yes Richarda OverlieNayana Abrol, MD  Insulin Glargine (LANTUS) 100 UNIT/ML Solostar Pen Inject 55 Units into the skin  daily at 10 pm. 08/25/15  Yes Richarda Overlie, MD  pantoprazole (PROTONIX) 40 MG tablet Take 1 tablet (40 mg total) by mouth daily. 08/25/15  Yes Richarda Overlie, MD    Physical Exam: Filed Vitals:   08/29/15 2119 08/29/15 2120 08/29/15 2125 08/29/15 2145  BP:  129/88  125/89  Pulse:   88 94  Temp:      TempSrc:       Resp:    18  SpO2: 97%  97% 96%   General: Not in acute distress HEENT:       Eyes: PERRL, EOMI, no scleral icterus.       ENT: No discharge from the ears and nose, no pharynx injection, no tonsillar enlargement.        Neck: No JVD, no bruit, no mass felt. Heme: No neck lymph node enlargement. Cardiac: S1/S2, RRR, No murmurs, No gallops or rubs. Pulm: Good air movement bilaterally. No rales, wheezing, rhonchi or rubs. Abd: Soft, nondistended, nontender, no rebound pain, no organomegaly, BS present. Ext: No pitting leg edema bilaterally. 2+DP/PT pulse bilaterally. Musculoskeletal: No joint deformities, No joint redness or warmth, no limitation of ROM in spin. Skin: No rashes.  Neuro: Alert, oriented X3, cranial nerves II-XII grossly intact, muscle strength 5/5 in all extremities, sensation to light touch intact. Brachial reflex 2+ bilaterally. Knee reflex 1+ bilaterally. Negative Babinski's sign. Normal finger to nose test. Psych: Patient is not psychotic, no suicidal or hemocidal ideation.  Labs on Admission:  Basic Metabolic Panel:  Recent Labs Lab 08/23/15 1619 08/23/15 2217 08/24/15 0340 08/25/15 0511 08/29/15 1939  NA 123* 138 148* 134* 131*  K 6.2* 4.5 4.1 3.3* 4.5  CL 86* 102 115* 103 95*  CO2 17* 17* 21* 21* 17*  GLUCOSE 1395* 730* 244* 383* 692*  BUN 31* 28* 23* 14 28*  CREATININE 2.41* 2.14* 1.68* 1.49* 1.72*  CALCIUM 9.6 9.9 9.8 8.3* 9.7  MG  --  3.0*  --   --   --   PHOS  --  3.7  --   --   --    Liver Function Tests:  Recent Labs Lab 08/23/15 1619 08/24/15 0340 08/25/15 0511  AST 32 33 44*  ALT 35 31 30  ALKPHOS 103 80 64  BILITOT 1.3* 1.0 0.9  PROT 9.1* 8.7* 6.6  ALBUMIN 3.9 3.8 2.8*    Recent Labs Lab 08/23/15 1619  LIPASE 42   No results for input(s): AMMONIA in the last 168 hours. CBC:  Recent Labs Lab 08/23/15 1656 08/24/15 0340 08/25/15 0511 08/29/15 1939  WBC 6.9 7.9 6.7 9.5  NEUTROABS 5.4  --   --   --   HGB 15.7 16.0 13.4  15.4  HCT 44.1 46.7 40.3 45.8  MCV 77.5* 80.4 81.6 81.6  PLT 90* 246 169 266   Cardiac Enzymes: No results for input(s): CKTOTAL, CKMB, CKMBINDEX, TROPONINI in the last 168 hours.  BNP (last 3 results) No results for input(s): BNP in the last 8760 hours.  ProBNP (last 3 results) No results for input(s): PROBNP in the last 8760 hours.  CBG:  Recent Labs Lab 08/25/15 0741 08/25/15 1153 08/25/15 1709 08/29/15 1939 08/29/15 2157  GLUCAP 392* 342* 322* >600* 564*    Radiological Exams on Admission: No results found.  EKG: Not done in ED, will get one.   Assessment/Plan Principal Problem:   DKA (diabetic ketoacidoses) (HCC) Active Problems:   Asthma   Obesity   Type 2 diabetes mellitus with stage 3  chronic kidney disease (HCC)   Acute renal failure superimposed on stage 3 chronic kidney disease (HCC)   GERD (gastroesophageal reflux disease)   HLD (hyperlipidemia)  DKA (diabetic ketoacidoses) (HCC): pt has AG 19, potassium of 4.5, bicarbonate 17 and positive ketone in urine, consistent with DKA. Mental status is normal. No signs of infection. This is obviously due to insulin noncompliance.  - Admit to stepdown  - IVF: 3L of NS bolus - start DKA protocol with BMP q4h - IVF: NS 125 cc/h; will switch to D5-1/2NS when CBG<250 - replete K as needed - Zofran prn nausea  - NPO  - urine culture  Asthma: stable. -Continue Abulterol Nebs prn  Type 2 diabetes mellitus with stage 3 chronic kidney disease (HCC): Newly diagnosed. No A1c on record. Patient is supposed to take Lantus 55 units and NovoLog pre-meals, but has not been compliant due to  financial difficulty.  -On DAK protocol -check A1c -consult to case manager for medication needs.   AoCKD-III: Previous Cre is 1.49 on 08/25/15, his Cre is 1.72, BUN 28 on admission. Likely due to prerenal secondary to dehydration - IVF as above - Check FeNa - US-renal - Follow up renal function by BMP - Avoid ACEI and  NSAIDs  GERD: -Protonix   HLD: Last LDL was not calculated in recent FLP due to elevated TG which was 425. -Continue home medications: Lipitor  -Check direct LDL    DVT ppx: SQ Heparin     Code Status: Full code Family Communication:  Yes, patient's wife at bed side Disposition Plan: Admit to inpatient   Date of Service 08/29/2015    Lorretta Harp [facility] Pager [phone]  If 7PM-7AM, please contact night-coverage [email or web address] Password [facility] 08/29/2015, 10:23 PM

## 2015-08-30 ENCOUNTER — Encounter (HOSPITAL_COMMUNITY): Payer: Self-pay

## 2015-08-30 DIAGNOSIS — N179 Acute kidney failure, unspecified: Secondary | ICD-10-CM

## 2015-08-30 DIAGNOSIS — N183 Chronic kidney disease, stage 3 (moderate): Secondary | ICD-10-CM

## 2015-08-30 LAB — GLUCOSE, CAPILLARY
GLUCOSE-CAPILLARY: 210 mg/dL — AB (ref 65–99)
GLUCOSE-CAPILLARY: 211 mg/dL — AB (ref 65–99)
GLUCOSE-CAPILLARY: 223 mg/dL — AB (ref 65–99)
GLUCOSE-CAPILLARY: 226 mg/dL — AB (ref 65–99)
GLUCOSE-CAPILLARY: 239 mg/dL — AB (ref 65–99)
GLUCOSE-CAPILLARY: 240 mg/dL — AB (ref 65–99)
GLUCOSE-CAPILLARY: 246 mg/dL — AB (ref 65–99)
GLUCOSE-CAPILLARY: 250 mg/dL — AB (ref 65–99)
GLUCOSE-CAPILLARY: 278 mg/dL — AB (ref 65–99)
GLUCOSE-CAPILLARY: 299 mg/dL — AB (ref 65–99)
GLUCOSE-CAPILLARY: 370 mg/dL — AB (ref 65–99)
Glucose-Capillary: 341 mg/dL — ABNORMAL HIGH (ref 65–99)
Glucose-Capillary: 226 mg/dL — ABNORMAL HIGH (ref 65–99)

## 2015-08-30 LAB — BASIC METABOLIC PANEL
ANION GAP: 8 (ref 5–15)
ANION GAP: 6 (ref 5–15)
Anion gap: 11 (ref 5–15)
Anion gap: 16 — ABNORMAL HIGH (ref 5–15)
Anion gap: 9 (ref 5–15)
BUN: 15 mg/dL (ref 6–20)
BUN: 17 mg/dL (ref 6–20)
BUN: 18 mg/dL (ref 6–20)
BUN: 21 mg/dL — ABNORMAL HIGH (ref 6–20)
BUN: 26 mg/dL — ABNORMAL HIGH (ref 6–20)
CHLORIDE: 102 mmol/L (ref 101–111)
CHLORIDE: 105 mmol/L (ref 101–111)
CHLORIDE: 106 mmol/L (ref 101–111)
CHLORIDE: 107 mmol/L (ref 101–111)
CHLORIDE: 109 mmol/L (ref 101–111)
CO2: 16 mmol/L — AB (ref 22–32)
CO2: 22 mmol/L (ref 22–32)
CO2: 22 mmol/L (ref 22–32)
CO2: 23 mmol/L (ref 22–32)
CO2: 24 mmol/L (ref 22–32)
CREATININE: 1.25 mg/dL — AB (ref 0.61–1.24)
CREATININE: 1.58 mg/dL — AB (ref 0.61–1.24)
CREATININE: 1.72 mg/dL — AB (ref 0.61–1.24)
Calcium: 8.4 mg/dL — ABNORMAL LOW (ref 8.9–10.3)
Calcium: 8.4 mg/dL — ABNORMAL LOW (ref 8.9–10.3)
Calcium: 8.5 mg/dL — ABNORMAL LOW (ref 8.9–10.3)
Calcium: 8.6 mg/dL — ABNORMAL LOW (ref 8.9–10.3)
Calcium: 9 mg/dL (ref 8.9–10.3)
Creatinine, Ser: 1.24 mg/dL (ref 0.61–1.24)
Creatinine, Ser: 1.19 mg/dL (ref 0.61–1.24)
GFR calc Af Amer: >60 mL/min (ref >60)
GFR calc non Af Amer: 50 mL/min — ABNORMAL LOW (ref >60)
GFR calc non Af Amer: 55 mL/min — ABNORMAL LOW (ref >60)
GFR calc non Af Amer: >60 mL/min (ref >60)
GFR calc non Af Amer: >60 mL/min (ref >60)
GFR calc non Af Amer: >60 mL/min (ref >60)
GFR, EST AFRICAN AMERICAN: 58 mL/min — AB (ref >60)
Glucose, Bld: 238 mg/dL — ABNORMAL HIGH (ref 65–99)
Glucose, Bld: 262 mg/dL — ABNORMAL HIGH (ref 65–99)
Glucose, Bld: 276 mg/dL — ABNORMAL HIGH (ref 65–99)
Glucose, Bld: 341 mg/dL — ABNORMAL HIGH (ref 65–99)
Glucose, Bld: 475 mg/dL — ABNORMAL HIGH (ref 65–99)
POTASSIUM: 3.4 mmol/L — AB (ref 3.5–5.1)
POTASSIUM: 3.7 mmol/L (ref 3.5–5.1)
POTASSIUM: 3.9 mmol/L (ref 3.5–5.1)
POTASSIUM: 3.9 mmol/L (ref 3.5–5.1)
POTASSIUM: 4.7 mmol/L (ref 3.5–5.1)
SODIUM: 138 mmol/L (ref 135–145)
SODIUM: 138 mmol/L (ref 135–145)
Sodium: 134 mmol/L — ABNORMAL LOW (ref 135–145)
Sodium: 138 mmol/L (ref 135–145)
Sodium: 138 mmol/L (ref 135–145)

## 2015-08-30 LAB — MRSA PCR SCREENING: MRSA BY PCR: NEGATIVE

## 2015-08-30 MED ORDER — INSULIN STARTER KIT- PEN NEEDLES (ENGLISH)
1.0000 | Freq: Once | Status: AC
  Administered 2015-08-30: 1
  Filled 2015-08-30: qty 1

## 2015-08-30 MED ORDER — INSULIN ASPART PROT & ASPART (70-30 MIX) 100 UNIT/ML ~~LOC~~ SUSP
40.0000 [IU] | Freq: Two times a day (BID) | SUBCUTANEOUS | Status: DC
  Administered 2015-08-30 (×2): 40 [IU] via SUBCUTANEOUS
  Filled 2015-08-30: qty 10

## 2015-08-30 MED ORDER — LIVING WELL WITH DIABETES BOOK
Freq: Once | Status: AC
  Administered 2015-08-30: 1
  Filled 2015-08-30: qty 1

## 2015-08-30 MED ORDER — INSULIN ASPART 100 UNIT/ML ~~LOC~~ SOLN
0.0000 [IU] | Freq: Every day | SUBCUTANEOUS | Status: DC
  Administered 2015-08-30: 2 [IU] via SUBCUTANEOUS

## 2015-08-30 MED ORDER — INSULIN ASPART 100 UNIT/ML ~~LOC~~ SOLN
0.0000 [IU] | Freq: Three times a day (TID) | SUBCUTANEOUS | Status: DC
  Administered 2015-08-30: 5 [IU] via SUBCUTANEOUS
  Administered 2015-08-30: 8 [IU] via SUBCUTANEOUS
  Administered 2015-08-31: 3 [IU] via SUBCUTANEOUS
  Administered 2015-08-31: 8 [IU] via SUBCUTANEOUS

## 2015-08-30 MED ORDER — INSULIN STARTER KIT- SYRINGES (ENGLISH)
1.0000 | Freq: Once | Status: AC
  Administered 2015-08-30: 1
  Filled 2015-08-30: qty 1

## 2015-08-30 NOTE — Care Management Note (Signed)
Case Management Note  Patient Details  Name: Nicholas Barrett MRN: [id number] Date of Birth: [birth year]  Subjective/Objective:   Pt lives with spouse, did not fill scripts for insulin because of cost.  Now has Medicaid - provided list of Medicaid preferred insulins to Diabetes Educator, she will make recommendations based on coverage.  Pt [place] [employer] [place] and was referred to [facility] for primary care services and has an appointment for Friday, December 16 @ 10:45 a.m.  Pt still has BCBS coverage but that insurance will term @ the end of November.                           Expected Discharge Plan:  Home/Self Care  Discharge planning Services  CM Consult, Medication Assistance  Status of Service:  In process, will continue to follow  Magdalene RiverMayo, Lindee Leason T, RN 08/30/2015, 2:16 PM

## 2015-08-30 NOTE — Progress Notes (Signed)
Utilization Review Completed.  

## 2015-08-30 NOTE — Progress Notes (Addendum)
Inpatient Diabetes Program Recommendations  AACE/ADA: New Consensus Statement on Inpatient Glycemic Control (2015)  Target Ranges:  Prepandial:   less than 140 mg/dL      Peak postprandial:   less than 180 mg/dL (1-2 hours)      Critically ill patients:  140 - 180 mg/dL   Review of Glycemic Control  Have spoken at length with patient regarding course of events during hospitalization last weekend. Pt was not aware that his BCBS policy would not cover his insulins at an affordable co-pay. Pt had Medicaid and knows to use this coverage only. Will teach patient how to use an insulin pen -ordered starter kit, ed'l videos on dm on system network. Ordered dm teaching booklet which patient is reviewing now as well.  Pt to see his MD on Dec 16 th-needs to make sure his appt is still valid and accepts medicaid. At discharge, please order the Novolog 70/30 flex pens, the needles, a meter covered by medicaid and the strips.  Thank you Rosita Kea, RN, MSN, CDE  Diabetes Inpatient Program Office: [phone] Pager: [phone] 8:00 am to 5:00 pm  Ad-taught demonstrated and explained to patient how to use the insulin pen using the pen starter kit and a demo pen. Patient was able to demonstrate the process back to me (teach-back) and verbalized his understanding and ability. Thank you Rosita Kea, RN, MSN, CDE  Diabetes Inpatient Program Office: [phone] Pager: [phone] 8:00 am to 5:00 pm

## 2015-08-30 NOTE — Progress Notes (Addendum)
Inpatient Diabetes Program Recommendations  AACE/ADA: New Consensus Statement on Inpatient Glycemic Control (2015)  Target Ranges:  Prepandial:   less than 140 mg/dL      Peak postprandial:   less than 180 mg/dL (1-2 hours)      Critically ill patients:  140 - 180 mg/dL   Review of Glycemic Control  Diabetes history: Recent dx of dm 2-discharged on 08/25/15 on lantus 55 units and novolog 7 units tidwc. (Unsure why patient could not afford his home insulin as care management noted pt to have BCBS which [facility].. However, pt could not get appt with his MD until mid Dec., 2016 Outpatient Diabetes medications: Discharged on lantus and novolog per above Current orders for Inpatient glycemic control: Novolog 70/30 40 units bid  Inpatient Diabetes Program Recommendations:    Will order in-patient education on using a vial and syringe for home insulin, as it appears that patient cannot afford the lantus and novolog. Will talk with patient and care management for further assessment of needs.   Thank you Lenor CoffinAnn Teodoro Jeffreys, RN, MSN, CDE  Diabetes Inpatient Program Office: [phone] Pager: [phone] 8:00 am to 5:00 pm

## 2015-08-30 NOTE — Progress Notes (Signed)
[facility] PROGRESS NOTE  Edmon CrapeGary Sleight WUJ:[id number] DOB: 04-12-1981 DOA: 08/29/2015 PCP: No PCP Per Patient  Length of stay: 1   Assessment/Plan: Principal Problem:   DKA (diabetic ketoacidoses) (HCC) Active Problems:   Asthma   Obesity   Type 2 diabetes mellitus with stage 3 chronic kidney disease (HCC)   Acute renal failure superimposed on stage 3 chronic kidney disease (HCC)   GERD (gastroesophageal reflux disease)   HLD (hyperlipidemia)    DKA (diabetic ketoacidoses) (HCC): pt has AG 19, potassium of 4.5, bicarbonate 17 and positive ketone in urine, consistent with DKA. Mental status is normal. No signs of infection. This is obviously due to insulin noncompliance. Start patient on NPH 70/30 as the patient could not afford Lantus. Start patient on a diet Continue IV fluids, diabetes coordinator consult case management consult to ensure for debility of medications    Asthma: stable. -Continue Abulterol Nebs prn  Type 2 diabetes mellitus with stage 3 chronic kidney disease (HCC): Newly diagnosed. Hemoglobin A1c pending. Started on NPH 70/30  AoCKD-III: Previous Cre is 1.49 on 08/25/15, his Cre is 1.72, BUN 28 on admission. Likely due to prerenal secondary to dehydration Improved after IV hydration now 1.25  GERD: -Protonix   HLD: Last LDL was not calculated in recent FLP due to elevated TG which was 425. -Continue home medications: Lipitor  -Check direct LDL    DVT prophylaxsis  heparin  Code Status:      Code Status Orders        Start     Ordered   08/29/15 2215  Full code   Continuous     08/29/15 2216     Family Communication: family updated about patient's clinical progress Disposition Plan:  Anticipate discharge tomorrow   Brief narrative: 34 y.o. male with PMH of recently diagnosed diabetes mellitus-II, hyperlipidemia, asthma, GERD, obesity, CKD-IV, who presents with generalized weakness and increased urinary frequency.  Patient was  recently hospitalized from 11/11-11/13 because of newly diagnosed diabetes mellitus and DKA. Patient was discharged on Lantus 55 units and NovoLog pre-meals. He states that he could not afford his insulin. He tried to schedule an appointment to establish care with PCP, however he was able to get an appointment until 09/27/15. He has not taken insulin in the past 5 days. He developed generalized weakness and increased urinary frequency. He denies dysuria or burning on urination, nausea, vomiting, diarrhea. He has very mild abdominal pain, no fever, chills, chest pain, shortness of breath, cough, unilateral weakness.  In ED, patient was found to have DKA with AG 19, potassium of 4.5, bicarbonate 17. Urinalysis positive for ketone and small amount of leukocytes. WBC 9.5, temperature normal, tachycardia, worsening renal function. The patient's admitted to inpatient for further eval and treatment.  Consultants:  None  Procedures:  None  Antibiotics: Anti-infectives    None         HPI/Subjective: Feeling better than yesterday   Objective: Filed Vitals:   08/29/15 2330 08/30/15 0000 08/30/15 0024 08/30/15 0400  BP: 122/79 126/82 125/86 123/73  Pulse: 97 95  101  Temp:   97.8 F (36.6 C) 98.1 F (36.7 C)  TempSrc:   Oral Oral  Resp: 20 15 15 15   Height:   6' (1.829 m)   Weight:   108.5 kg (239 lb 3.2 oz)   SpO2: 96% 96% 97% 96%    Intake/Output Summary (Last 24 hours) at 08/30/15 0904 Last data filed at 08/30/15 0600  Gross per 24 hour  Intake 2537.5 ml  Output    650 ml  Net 1887.5 ml    Exam:  Cardiac: S1/S2, RRR, No murmurs, No gallops or rubs. Pulm: Good air movement bilaterally. No rales, wheezing, rhonchi or rubs. Abd: Soft, nondistended, nontender, no rebound pain, no organomegaly, BS present. Ext: No pitting leg edema bilaterally. 2+DP/PT pulse bilaterally. Musculoskeletal: No joint deformities, No joint redness or warmth, no limitation of ROM in spin. Skin: No  rashes.  Neuro: Alert, oriented X3, cranial nerves II-XII grossly intact, muscle strength 5/5 in all extremities, sensation to light touch intact. Brachial reflex 2+ bilaterally. Knee reflex 1+ bilaterally. Negative Babinski's sign. Normal finger to nose test.  Data Review   Micro Results Recent Results (from the past 240 hour(s))  MRSA PCR Screening     Status: None   Collection Time: 08/23/15  9:39 PM  Result Value Ref Range Status   MRSA by PCR NEGATIVE NEGATIVE Final    Comment:        The GeneXpert MRSA Assay (FDA approved for NASAL specimens only), is one component of a comprehensive MRSA colonization surveillance program. It is not intended to diagnose MRSA infection nor to guide or monitor treatment for MRSA infections.   MRSA PCR Screening     Status: None   Collection Time: 08/30/15 12:45 AM  Result Value Ref Range Status   MRSA by PCR NEGATIVE NEGATIVE Final    Comment:        The GeneXpert MRSA Assay (FDA approved for NASAL specimens only), is one component of a comprehensive MRSA colonization surveillance program. It is not intended to diagnose MRSA infection nor to guide or monitor treatment for MRSA infections.     Radiology Reports Dg Chest 2 View  08/23/2015  CLINICAL DATA:  Blurred vision, weakness and dizziness. EXAM: CHEST  2 VIEW COMPARISON:  None. FINDINGS: The cardiac silhouette, mediastinal and hilar contours are within normal limits. Day streaky density in the left lower lobe could reflect atelectasis or scar. No pleural effusion. The bony thorax is intact. IMPRESSION: Streaky left lower lobe atelectasis or scarring. No definite infiltrates, effusions or edema. Electronically Signed   By: Rudie Meyer M.D.   On: 08/23/2015 19:19   US Renal  08/29/2015  CLINICAL DATA:  Acute kidney injury. EXAM: RENAL / URINARY TRACT ULTRASOUND COMPLETE COMPARISON:  Renal ultrasound 5 days prior 08/24/2015 FINDINGS: Right Kidney: Length: 10.4 cm. The right  kidney is poorly visualized. No evidence of hydronephrosis. Left Kidney: Length: 14.0 cm.  No mass or hydronephrosis visualized. Bladder: Appears normal for degree of bladder distention. Both ureteral jets are seen. IMPRESSION: No hydronephrosis or change from prior exam. Right kidney is poorly visualized. Electronically Signed   By: Rubye Oaks M.D.   On: 08/29/2015 23:11   US Renal  08/24/2015  CLINICAL DATA:  Acute kidney injury EXAM: RENAL / URINARY TRACT ULTRASOUND COMPLETE COMPARISON:  None. FINDINGS: Right Kidney: Length: 11.7 cm. Suboptimally visualized. No mass or hydronephrosis. Left Kidney: Length: 13.1 cm. Upper pole is suboptimally visualized. No mass or hydronephrosis. Bladder: Within normal limits. IMPRESSION: No hydronephrosis. Electronically Signed   By: Charline Bills M.D.   On: 08/24/2015 12:38     CBC  Recent Labs Lab 08/23/15 1656 08/24/15 0340 08/25/15 0511 08/29/15 1939  WBC 6.9 7.9 6.7 9.5  HGB 15.7 16.0 13.4 15.4  HCT 44.1 46.7 40.3 45.8  PLT 90* 246 169 266  MCV 77.5* 80.4 81.6 81.6  MCH 27.6  27.5 27.1 27.5  MCHC 35.6 34.3 33.3 33.6  RDW 12.4 12.8 12.8 12.5  LYMPHSABS 1.3  --   --   --   MONOABS 0.3  --   --   --   EOSABS 0.0  --   --   --   BASOSABS 0.0  --   --   --     Chemistries   Recent Labs Lab 08/23/15 1619 08/23/15 2217 08/24/15 0340 08/25/15 0511 08/29/15 1939 08/30/15 0002 08/30/15 0241 08/30/15 0654  NA 123* 138 148* 134* 131* 134* 138 138  K 6.2* 4.5 4.1 3.3* 4.5 4.7 3.9 3.4*  CL 86* 102 115* 103 95* 102 105 106  CO2 17* 17* 21* 21* 17* 16* 22 24  GLUCOSE 1395* 730* 244* 383* 692* 475* 341* 238*  BUN 31* 28* 23* 14 28* 26* 21* 18  CREATININE 2.41* 2.14* 1.68* 1.49* 1.72* 1.72* 1.58* 1.25*  CALCIUM 9.6 9.9 9.8 8.3* 9.7 9.0 8.6* 8.4*  MG  --  3.0*  --   --   --   --   --   --   AST 32  --  33 44*  --   --   --   --   ALT 35  --  31 30  --   --   --   --   ALKPHOS 103  --  80 64  --   --   --   --   BILITOT 1.3*  --  1.0  0.9  --   --   --   --    ------------------------------------------------------------------------------------------------------------------ estimated creatinine clearance is 105 mL/min (by C-G formula based on Cr of 1.25). ------------------------------------------------------------------------------------------------------------------ No results for input(s): HGBA1C in the last 72 hours. ------------------------------------------------------------------------------------------------------------------ No results for input(s): CHOL, HDL, LDLCALC, TRIG, CHOLHDL, LDLDIRECT in the last 72 hours. ------------------------------------------------------------------------------------------------------------------ No results for input(s): TSH, T4TOTAL, T3FREE, THYROIDAB in the last 72 hours.  Invalid input(s): FREET3 ------------------------------------------------------------------------------------------------------------------ No results for input(s): VITAMINB12, FOLATE, FERRITIN, TIBC, IRON, RETICCTPCT in the last 72 hours.  Coagulation profile No results for input(s): INR, PROTIME in the last 168 hours.  No results for input(s): DDIMER in the last 72 hours.  Cardiac Enzymes No results for input(s): CKMB, TROPONINI, MYOGLOBIN in the last 168 hours.  Invalid input(s): CK ------------------------------------------------------------------------------------------------------------------ Invalid input(s): POCBNP   CBG:  Recent Labs Lab 08/30/15 0146 08/30/15 0302 08/30/15 0411 08/30/15 0521 08/30/15 0623  GLUCAP 341* 278* 246* 226* 226*       Studies: US Renal  08/29/2015  CLINICAL DATA:  Acute kidney injury. EXAM: RENAL / URINARY TRACT ULTRASOUND COMPLETE COMPARISON:  Renal ultrasound 5 days prior 08/24/2015 FINDINGS: Right Kidney: Length: 10.4 cm. The right kidney is poorly visualized. No evidence of hydronephrosis. Left Kidney: Length: 14.0 cm.  No mass or hydronephrosis  visualized. Bladder: Appears normal for degree of bladder distention. Both ureteral jets are seen. IMPRESSION: No hydronephrosis or change from prior exam. Right kidney is poorly visualized. Electronically Signed   By: Rubye Oaks M.D.   On: 08/29/2015 23:11      No results found for: HGBA1C Lab Results  Component Value Date   LDLCALC UNABLE TO CALCULATE IF TRIGLYCERIDE OVER 400 mg/dL 40/98/1191   CREATININE 1.25* 08/30/2015       Scheduled Meds: . atorvastatin  40 mg Oral q1800  . heparin  5,000 Units Subcutaneous 3 times per day  . insulin aspart protamine- aspart  40 Units Subcutaneous BID WC  . pantoprazole  40  mg Oral Daily   Continuous Infusions: . sodium chloride 1,000 mL (08/29/15 2332)  . sodium chloride Stopped (08/30/15 0414)  . dextrose 5 % and 0.45% NaCl 125 mL/hr at 08/30/15 0413  . insulin (NOVOLIN-R) infusion 3.3 Units/hr (08/30/15 1610)    Principal Problem:   DKA (diabetic ketoacidoses) (HCC) Active Problems:   Asthma   Obesity   Type 2 diabetes mellitus with stage 3 chronic kidney disease (HCC)   Acute renal failure superimposed on stage 3 chronic kidney disease (HCC)   GERD (gastroesophageal reflux disease)   HLD (hyperlipidemia)    Time spent: 45 minutes   [facility]  [facility] Pager [phone]. If 7PM-7AM, please contact night-coverage at [email or web address], password [email or web address] 08/30/2015, 9:04 AM  LOS: 1 day

## 2015-08-31 LAB — COMPREHENSIVE METABOLIC PANEL
ALBUMIN: 2.5 g/dL — AB (ref 3.5–5.0)
ALK PHOS: 65 U/L (ref 38–126)
ALT: 40 U/L (ref 17–63)
ANION GAP: 7 (ref 5–15)
AST: 31 U/L (ref 15–41)
BILIRUBIN TOTAL: 0.5 mg/dL (ref 0.3–1.2)
BUN: 13 mg/dL (ref 6–20)
CALCIUM: 8 mg/dL — AB (ref 8.9–10.3)
CO2: 22 mmol/L (ref 22–32)
Chloride: 109 mmol/L (ref 101–111)
Creatinine, Ser: 1.2 mg/dL (ref 0.61–1.24)
GFR calc non Af Amer: >60 mL/min (ref >60)
GLUCOSE: 320 mg/dL — AB (ref 65–99)
POTASSIUM: 3.4 mmol/L — AB (ref 3.5–5.1)
SODIUM: 138 mmol/L (ref 135–145)
TOTAL PROTEIN: 6.2 g/dL — AB (ref 6.5–8.1)

## 2015-08-31 LAB — URINE CULTURE

## 2015-08-31 LAB — CBC
HEMATOCRIT: 38.7 % — AB (ref 39.0–52.0)
HEMOGLOBIN: 12.6 g/dL — AB (ref 13.0–17.0)
MCH: 26.9 pg (ref 26.0–34.0)
MCHC: 32.6 g/dL (ref 30.0–36.0)
MCV: 82.5 fL (ref 78.0–100.0)
Platelets: 211 10*3/uL (ref 150–400)
RBC: 4.69 MIL/uL (ref 4.22–5.81)
RDW: 12.9 % (ref 11.5–15.5)
WBC: 5.6 10*3/uL (ref 4.0–10.5)

## 2015-08-31 LAB — GLUCOSE, CAPILLARY
Glucose-Capillary: 279 mg/dL — ABNORMAL HIGH (ref 65–99)
Glucose-Capillary: 191 mg/dL — ABNORMAL HIGH (ref 65–99)

## 2015-08-31 LAB — HEMOGLOBIN A1C
Hgb A1c MFr Bld: 13.7 % — ABNORMAL HIGH (ref 4.8–5.6)
MEAN PLASMA GLUCOSE: 346 mg/dL

## 2015-08-31 LAB — LDL CHOLESTEROL, DIRECT: Direct LDL: 114 mg/dL — ABNORMAL HIGH (ref 0–99)

## 2015-08-31 MED ORDER — INSULIN ASPART PROT & ASPART (70-30 MIX) 100 UNIT/ML PEN: 45.0000 [IU] | PEN_INJECTOR | Freq: Two times a day (BID) | SUBCUTANEOUS | Status: AC

## 2015-08-31 MED ORDER — FREESTYLE LANCETS MISC: Status: AC

## 2015-08-31 MED ORDER — INSULIN PEN NEEDLE 30G X 8 MM MISC: 1.0000 | Status: DC | PRN

## 2015-08-31 MED ORDER — FREESTYLE SYSTEM KIT: 1.0000 | PACK | Status: AC | PRN

## 2015-08-31 MED ORDER — POTASSIUM CHLORIDE CRYS ER 20 MEQ PO TBCR
40.0000 meq | EXTENDED_RELEASE_TABLET | Freq: Once | ORAL | Status: AC
  Administered 2015-08-31: 40 meq via ORAL
  Filled 2015-08-31 (×2): qty 2

## 2015-08-31 MED ORDER — INSULIN PEN NEEDLE 30G X 5 MM MISC: Status: DC

## 2015-08-31 MED ORDER — BLOOD GLUC METER DISP-STRIPS DEVI: Status: AC

## 2015-08-31 MED ORDER — INSULIN PEN NEEDLE 30G X 5 MM MISC: Status: AC

## 2015-08-31 MED ORDER — FREESTYLE SYSTEM KIT: 1.0000 | PACK | Status: DC | PRN

## 2015-08-31 MED ORDER — BLOOD GLUC METER DISP-STRIPS DEVI: Status: DC

## 2015-08-31 MED ORDER — FREESTYLE LANCETS MISC: Status: DC

## 2015-08-31 MED ORDER — INSULIN ASPART PROT & ASPART (70-30 MIX) 100 UNIT/ML ~~LOC~~ SUSP
45.0000 [IU] | Freq: Two times a day (BID) | SUBCUTANEOUS | Status: DC
  Administered 2015-08-31: 45 [IU] via SUBCUTANEOUS

## 2015-08-31 NOTE — Progress Notes (Addendum)
Cm received call from pt as he checked with [employer] for coverage by Medicaid.  CM called the pharmacist  and the following specifics need to be written for prescriptions: Aviva Accucheck Plus Gluccometer; Aviva Accucheck test strips (please note how many strips and how many times the pt is to check his blood per day) strips come in boxes of 50 or 100); Aviva softclicks lancets (please note how many strips and how many times the pt is to check his blood per day) strips come in boxes of 50 or 100); Please change the flexpen needle prescription to a generic instruction: " Use pen needles as directed." CM requested prescribing MD to escribe to same pharmacy.  No other CM needs were communicated.

## 2015-08-31 NOTE — Discharge Summary (Signed)
Physician Discharge Summary  Nicholas Barrett MRN: [id number] DOB/AGE: 14-Jan-1981 34 y.o.  PCP: No PCP Per Patient   Admit date: 08/29/2015 Discharge date: 08/31/2015  Discharge Diagnoses:     Principal Problem:   DKA (diabetic ketoacidoses) ([place]) Active Problems:   Asthma   Obesity   Type 2 diabetes mellitus with stage 3 chronic kidney disease (HCC)   Acute renal failure superimposed on stage 3 chronic kidney disease (HCC)   GERD (gastroesophageal reflux disease)   HLD (hyperlipidemia)    Follow-up recommendations Follow-up with PCP in 3-5 days , including all  additional recommended appointments as below Follow-up CBC, CMP in 3-5 days      Medication List    STOP taking these medications        Insulin Glargine 100 UNIT/ML Solostar Pen  Commonly known as:  LANTUS      TAKE these medications        albuterol (2.5 MG/3ML) 0.083% nebulizer solution  Commonly known as:  PROVENTIL  Take 2.5 mg by nebulization every 6 (six) hours as needed for wheezing or shortness of breath.     albuterol 108 (90 BASE) MCG/ACT inhaler  Commonly known as:  PROVENTIL HFA;VENTOLIN HFA  Inhale 2 puffs into the lungs every 6 (six) hours as needed for wheezing or shortness of breath.     atorvastatin 40 MG tablet  Commonly known as:  LIPITOR  Take 1 tablet (40 mg total) by mouth daily at 6 PM.     BLOOD GLUCOSE METER DISPOSABLE Devi  100     glucose monitoring kit monitoring kit  1 each by Does not apply route as needed for other.     insulin aspart 100 UNIT/ML FlexPen  Commonly known as:  NOVOLOG FLEXPEN  Inject 7 Units into the skin 3 (three) times daily with meals.     insulin aspart protamine - aspart (70-30) 100 UNIT/ML FlexPen  Commonly known as:  NOVOLOG 70/30 MIX  Inject 0.45 mLs (45 Units total) into the skin 2 (two) times daily.     Insulin Pen Needle 30G X 8 MM Misc  Commonly known as:  NOVOFINE  Inject 10 each into the skin as needed.     pantoprazole 40 MG tablet   Commonly known as:  PROTONIX  Take 1 tablet (40 mg total) by mouth daily.         Discharge Condition: * Stable  Discharge Instructions       Discharge Instructions    Diet - low sodium heart healthy    Complete by:  As directed      Increase activity slowly    Complete by:  As directed            No Known Allergies    Disposition: 01-Home or Self Care   Consults:  None     Significant Diagnostic Studies:  Dg Chest 2 View  08/23/2015  CLINICAL DATA:  Blurred vision, weakness and dizziness. EXAM: CHEST  2 VIEW COMPARISON:  None. FINDINGS: The cardiac silhouette, mediastinal and hilar contours are within normal limits. Day streaky density in the left lower lobe could reflect atelectasis or scar. No pleural effusion. The bony thorax is intact. IMPRESSION: Streaky left lower lobe atelectasis or scarring. No definite infiltrates, effusions or edema. Electronically Signed   By: Marijo Sanes M.D.   On: 08/23/2015 19:19   US Renal  08/29/2015  CLINICAL DATA:  Acute kidney injury. EXAM: RENAL / URINARY TRACT ULTRASOUND COMPLETE COMPARISON:  Renal ultrasound  5 days prior 08/24/2015 FINDINGS: Right Kidney: Length: 10.4 cm. The right kidney is poorly visualized. No evidence of hydronephrosis. Left Kidney: Length: 14.0 cm.  No mass or hydronephrosis visualized. Bladder: Appears normal for degree of bladder distention. Both ureteral jets are seen. IMPRESSION: No hydronephrosis or change from prior exam. Right kidney is poorly visualized. Electronically Signed   By: Jeb Levering M.D.   On: 08/29/2015 23:11   US Renal  08/24/2015  CLINICAL DATA:  Acute kidney injury EXAM: RENAL / URINARY TRACT ULTRASOUND COMPLETE COMPARISON:  None. FINDINGS: Right Kidney: Length: 11.7 cm. Suboptimally visualized. No mass or hydronephrosis. Left Kidney: Length: 13.1 cm. Upper pole is suboptimally visualized. No mass or hydronephrosis. Bladder: Within normal limits. IMPRESSION: No hydronephrosis.  Electronically Signed   By: Julian Hy M.D.   On: 08/24/2015 12:38       Filed Weights   08/30/15 0024  Weight: 108.5 kg (239 lb 3.2 oz)     Microbiology: Recent Results (from the past 240 hour(s))  MRSA PCR Screening     Status: None   Collection Time: 08/23/15  9:39 PM  Result Value Ref Range Status   MRSA by PCR NEGATIVE NEGATIVE Final    Comment:        The GeneXpert MRSA Assay (FDA approved for NASAL specimens only), is one component of a comprehensive MRSA colonization surveillance program. It is not intended to diagnose MRSA infection nor to guide or monitor treatment for MRSA infections.   MRSA PCR Screening     Status: None   Collection Time: 08/30/15 12:45 AM  Result Value Ref Range Status   MRSA by PCR NEGATIVE NEGATIVE Final    Comment:        The GeneXpert MRSA Assay (FDA approved for NASAL specimens only), is one component of a comprehensive MRSA colonization surveillance program. It is not intended to diagnose MRSA infection nor to guide or monitor treatment for MRSA infections.        Blood Culture No results found for: SDES, SPECREQUEST, CULT, REPTSTATUS    Labs: Results for orders placed or performed during the hospital encounter of 08/29/15 (from the past 48 hour(s))  Basic metabolic panel     Status: Abnormal   Collection Time: 08/29/15  7:39 PM  Result Value Ref Range   Sodium 131 (L) 135 - 145 mmol/L   Potassium 4.5 3.5 - 5.1 mmol/L   Chloride 95 (L) 101 - 111 mmol/L   CO2 17 (L) 22 - 32 mmol/L   Glucose, Bld 692 (HH) 65 - 99 mg/dL    Comment: CRITICAL RESULT CALLED TO, READ BACK BY AND VERIFIED WITH: B SANGALANG,RN 2030 08/29/15 D BRADLEY    BUN 28 (H) 6 - 20 mg/dL   Creatinine, Ser 1.72 (H) 0.61 - 1.24 mg/dL   Calcium 9.7 8.9 - 10.3 mg/dL   GFR calc non Af Amer 50 (L) >60 mL/min   GFR calc Af Amer 58 (L) >60 mL/min    Comment: (NOTE) The eGFR has been calculated using the CKD EPI equation. This calculation has not  been validated in all clinical situations. eGFR's persistently <60 mL/min signify possible Chronic Kidney Disease.    Anion gap 19 (H) 5 - 15  CBC     Status: None   Collection Time: 08/29/15  7:39 PM  Result Value Ref Range   WBC 9.5 4.0 - 10.5 K/uL   RBC 5.61 4.22 - 5.81 MIL/uL   Hemoglobin 15.4 13.0 - 17.0 g/dL  HCT 45.8 39.0 - 52.0 %   MCV 81.6 78.0 - 100.0 fL   MCH 27.5 26.0 - 34.0 pg   MCHC 33.6 30.0 - 36.0 g/dL   RDW 12.5 11.5 - 15.5 %   Platelets 266 150 - 400 K/uL  CBG monitoring, ED     Status: Abnormal   Collection Time: 08/29/15  7:39 PM  Result Value Ref Range   Glucose-Capillary >600 (HH) 65 - 99 mg/dL  Urinalysis, Routine w reflex microscopic (not at [facility])     Status: Abnormal   Collection Time: 08/29/15  7:41 PM  Result Value Ref Range   Color, Urine YELLOW YELLOW   APPearance CLEAR CLEAR   Specific Gravity, Urine 1.034 (H) 1.005 - 1.030   pH 5.0 5.0 - 8.0   Glucose, UA >1000 (A) NEGATIVE mg/dL   Hgb urine dipstick SMALL (A) NEGATIVE   Bilirubin Urine NEGATIVE NEGATIVE   Ketones, ur 40 (A) NEGATIVE mg/dL   Protein, ur NEGATIVE NEGATIVE mg/dL   Nitrite NEGATIVE NEGATIVE   Leukocytes, UA SMALL (A) NEGATIVE  Urine microscopic-add on     Status: Abnormal   Collection Time: 08/29/15  7:41 PM  Result Value Ref Range   Squamous Epithelial / LPF 0-5 (A) NONE SEEN    Comment: Please note change in reference range.   WBC, UA 6-30 0 - 5 WBC/hpf    Comment: Please note change in reference range.   RBC / HPF 0-5 0 - 5 RBC/hpf    Comment: Please note change in reference range.   Bacteria, UA RARE (A) NONE SEEN    Comment: Please note change in reference range.  CBG monitoring, ED     Status: Abnormal   Collection Time: 08/29/15  9:57 PM  Result Value Ref Range   Glucose-Capillary 564 (HH) 65 - 99 mg/dL   Comment 1 Notify RN   CBG monitoring, ED     Status: Abnormal   Collection Time: 08/29/15 11:30 PM  Result Value Ref Range   Glucose-Capillary 466 (H) 65 - 99  mg/dL  Basic metabolic panel (stat then every 4 hours)     Status: Abnormal   Collection Time: 08/30/15 12:02 AM  Result Value Ref Range   Sodium 134 (L) 135 - 145 mmol/L   Potassium 4.7 3.5 - 5.1 mmol/L   Chloride 102 101 - 111 mmol/L   CO2 16 (L) 22 - 32 mmol/L   Glucose, Bld 475 (H) 65 - 99 mg/dL   BUN 26 (H) 6 - 20 mg/dL   Creatinine, Ser 1.72 (H) 0.61 - 1.24 mg/dL   Calcium 9.0 8.9 - 10.3 mg/dL   GFR calc non Af Amer 50 (L) >60 mL/min   GFR calc Af Amer 58 (L) >60 mL/min    Comment: (NOTE) The eGFR has been calculated using the CKD EPI equation. This calculation has not been validated in all clinical situations. eGFR's persistently <60 mL/min signify possible Chronic Kidney Disease.    Anion gap 16 (H) 5 - 15  Glucose, capillary     Status: Abnormal   Collection Time: 08/30/15 12:33 AM  Result Value Ref Range   Glucose-Capillary 370 (H) 65 - 99 mg/dL  MRSA PCR Screening     Status: None   Collection Time: 08/30/15 12:45 AM  Result Value Ref Range   MRSA by PCR NEGATIVE NEGATIVE    Comment:        The GeneXpert MRSA Assay (FDA approved for NASAL specimens only), is one component  of a comprehensive MRSA colonization surveillance program. It is not intended to diagnose MRSA infection nor to guide or monitor treatment for MRSA infections.   Glucose, capillary     Status: Abnormal   Collection Time: 08/30/15  1:46 AM  Result Value Ref Range   Glucose-Capillary 341 (H) 65 - 99 mg/dL  Basic metabolic panel (stat then every 4 hours)     Status: Abnormal   Collection Time: 08/30/15  2:41 AM  Result Value Ref Range   Sodium 138 135 - 145 mmol/L   Potassium 3.9 3.5 - 5.1 mmol/L    Comment: DELTA CHECK NOTED NO VISIBLE HEMOLYSIS    Chloride 105 101 - 111 mmol/L   CO2 22 22 - 32 mmol/L   Glucose, Bld 341 (H) 65 - 99 mg/dL   BUN 21 (H) 6 - 20 mg/dL   Creatinine, Ser 1.58 (H) 0.61 - 1.24 mg/dL   Calcium 8.6 (L) 8.9 - 10.3 mg/dL   GFR calc non Af Amer 55 (L) >60 mL/min    GFR calc Af Amer >60 >60 mL/min    Comment: (NOTE) The eGFR has been calculated using the CKD EPI equation. This calculation has not been validated in all clinical situations. eGFR's persistently <60 mL/min signify possible Chronic Kidney Disease.    Anion gap 11 5 - 15  Hemoglobin A1c     Status: Abnormal   Collection Time: 08/30/15  2:41 AM  Result Value Ref Range   Hgb A1c MFr Bld 13.7 (H) 4.8 - 5.6 %    Comment: (NOTE)         Pre-diabetes: 5.7 - 6.4         Diabetes: >6.4         Glycemic control for adults with diabetes: <7.0    Mean Plasma Glucose 346 mg/dL    Comment: (NOTE) Performed At: [facility] [place], [place] [id number] Lindon Romp MD FU:[id number]   Glucose, capillary     Status: Abnormal   Collection Time: 08/30/15  3:02 AM  Result Value Ref Range   Glucose-Capillary 278 (H) 65 - 99 mg/dL  Glucose, capillary     Status: Abnormal   Collection Time: 08/30/15  4:11 AM  Result Value Ref Range   Glucose-Capillary 246 (H) 65 - 99 mg/dL  Glucose, capillary     Status: Abnormal   Collection Time: 08/30/15  5:21 AM  Result Value Ref Range   Glucose-Capillary 226 (H) 65 - 99 mg/dL  Glucose, capillary     Status: Abnormal   Collection Time: 08/30/15  6:23 AM  Result Value Ref Range   Glucose-Capillary 226 (H) 65 - 99 mg/dL  Basic metabolic panel (stat then every 4 hours)     Status: Abnormal   Collection Time: 08/30/15  6:54 AM  Result Value Ref Range   Sodium 138 135 - 145 mmol/L   Potassium 3.4 (L) 3.5 - 5.1 mmol/L   Chloride 106 101 - 111 mmol/L   CO2 24 22 - 32 mmol/L   Glucose, Bld 238 (H) 65 - 99 mg/dL   BUN 18 6 - 20 mg/dL   Creatinine, Ser 1.25 (H) 0.61 - 1.24 mg/dL   Calcium 8.4 (L) 8.9 - 10.3 mg/dL   GFR calc non Af Amer >60 >60 mL/min   GFR calc Af Amer >60 >60 mL/min    Comment: (NOTE) The eGFR has been calculated using the CKD EPI equation. This calculation has not been validated in all clinical  situations. eGFR's persistently <60 mL/min signify possible Chronic Kidney Disease.    Anion gap 8 5 - 15  Glucose, capillary     Status: Abnormal   Collection Time: 08/30/15  7:54 AM  Result Value Ref Range   Glucose-Capillary 223 (H) 65 - 99 mg/dL  Glucose, capillary     Status: Abnormal   Collection Time: 08/30/15  9:12 AM  Result Value Ref Range   Glucose-Capillary 239 (H) 65 - 99 mg/dL  Glucose, capillary     Status: Abnormal   Collection Time: 08/30/15 10:12 AM  Result Value Ref Range   Glucose-Capillary 250 (H) 65 - 99 mg/dL  Basic metabolic panel (stat then every 4 hours)     Status: Abnormal   Collection Time: 08/30/15 10:40 AM  Result Value Ref Range   Sodium 138 135 - 145 mmol/L   Potassium 3.7 3.5 - 5.1 mmol/L   Chloride 109 101 - 111 mmol/L   CO2 23 22 - 32 mmol/L   Glucose, Bld 262 (H) 65 - 99 mg/dL   BUN 17 6 - 20 mg/dL   Creatinine, Ser 1.24 0.61 - 1.24 mg/dL   Calcium 8.4 (L) 8.9 - 10.3 mg/dL   GFR calc non Af Amer >60 >60 mL/min   GFR calc Af Amer >60 >60 mL/min    Comment: (NOTE) The eGFR has been calculated using the CKD EPI equation. This calculation has not been validated in all clinical situations. eGFR's persistently <60 mL/min signify possible Chronic Kidney Disease.    Anion gap 6 5 - 15  LDL cholesterol, direct     Status: Abnormal   Collection Time: 08/30/15 10:40 AM  Result Value Ref Range   Direct LDL 114 (H) 0 - 99 mg/dL    Comment: (NOTE) Performed At: [facility] [place], [place] [id number] Lindon Romp MD KG:[id number]   Glucose, capillary     Status: Abnormal   Collection Time: 08/30/15 11:19 AM  Result Value Ref Range   Glucose-Capillary 240 (H) 65 - 99 mg/dL  Glucose, capillary     Status: Abnormal   Collection Time: 08/30/15 12:14 PM  Result Value Ref Range   Glucose-Capillary 210 (H) 65 - 99 mg/dL  Basic metabolic panel (stat then every 4 hours)     Status: Abnormal   Collection Time: 08/30/15   2:49 PM  Result Value Ref Range   Sodium 138 135 - 145 mmol/L   Potassium 3.9 3.5 - 5.1 mmol/L   Chloride 107 101 - 111 mmol/L   CO2 22 22 - 32 mmol/L   Glucose, Bld 276 (H) 65 - 99 mg/dL   BUN 15 6 - 20 mg/dL   Creatinine, Ser 1.19 0.61 - 1.24 mg/dL   Calcium 8.5 (L) 8.9 - 10.3 mg/dL   GFR calc non Af Amer >60 >60 mL/min   GFR calc Af Amer >60 >60 mL/min    Comment: (NOTE) The eGFR has been calculated using the CKD EPI equation. This calculation has not been validated in all clinical situations. eGFR's persistently <60 mL/min signify possible Chronic Kidney Disease.    Anion gap 9 5 - 15  Glucose, capillary     Status: Abnormal   Collection Time: 08/30/15  5:19 PM  Result Value Ref Range   Glucose-Capillary 299 (H) 65 - 99 mg/dL  Glucose, capillary     Status: Abnormal   Collection Time: 08/30/15  9:11 PM  Result Value Ref Range   Glucose-Capillary 211 (H) 65 -  99 mg/dL  Comprehensive metabolic panel     Status: Abnormal   Collection Time: 08/31/15  3:20 AM  Result Value Ref Range   Sodium 138 135 - 145 mmol/L   Potassium 3.4 (L) 3.5 - 5.1 mmol/L   Chloride 109 101 - 111 mmol/L   CO2 22 22 - 32 mmol/L   Glucose, Bld 320 (H) 65 - 99 mg/dL   BUN 13 6 - 20 mg/dL   Creatinine, Ser 1.20 0.61 - 1.24 mg/dL   Calcium 8.0 (L) 8.9 - 10.3 mg/dL   Total Protein 6.2 (L) 6.5 - 8.1 g/dL   Albumin 2.5 (L) 3.5 - 5.0 g/dL   AST 31 15 - 41 U/L   ALT 40 17 - 63 U/L   Alkaline Phosphatase 65 38 - 126 U/L   Total Bilirubin 0.5 0.3 - 1.2 mg/dL   GFR calc non Af Amer >60 >60 mL/min   GFR calc Af Amer >60 >60 mL/min    Comment: (NOTE) The eGFR has been calculated using the CKD EPI equation. This calculation has not been validated in all clinical situations. eGFR's persistently <60 mL/min signify possible Chronic Kidney Disease.    Anion gap 7 5 - 15  CBC     Status: Abnormal   Collection Time: 08/31/15  3:20 AM  Result Value Ref Range   WBC 5.6 4.0 - 10.5 K/uL   RBC 4.69 4.22 - 5.81  MIL/uL   Hemoglobin 12.6 (L) 13.0 - 17.0 g/dL   HCT 38.7 (L) 39.0 - 52.0 %   MCV 82.5 78.0 - 100.0 fL   MCH 26.9 26.0 - 34.0 pg   MCHC 32.6 30.0 - 36.0 g/dL   RDW 12.9 11.5 - 15.5 %   Platelets 211 150 - 400 K/uL  Glucose, capillary     Status: Abnormal   Collection Time: 08/31/15  9:00 AM  Result Value Ref Range   Glucose-Capillary 279 (H) 65 - 99 mg/dL     Lipid Panel     Component Value Date/Time   CHOL 233* 08/25/2015 0511   TRIG 425* 08/25/2015 0511   HDL 43 08/25/2015 0511   CHOLHDL 5.4 08/25/2015 0511   VLDL UNABLE TO CALCULATE IF TRIGLYCERIDE OVER 400 mg/dL 08/25/2015 0511   LDLCALC UNABLE TO CALCULATE IF TRIGLYCERIDE OVER 400 mg/dL 08/25/2015 0511   LDLDIRECT 114* 08/30/2015 1040     Lab Results  Component Value Date   HGBA1C 13.7* 08/30/2015     Lab Results  Component Value Date   LDLCALC UNABLE TO CALCULATE IF TRIGLYCERIDE OVER 400 mg/dL 08/25/2015   CREATININE 1.20 08/31/2015     HPI  34 y.o. male with PMH of recently diagnosed diabetes mellitus-II, hyperlipidemia, asthma, GERD, obesity, CKD-IV, who presents with generalized weakness and increased urinary frequency.  Patient was recently hospitalized from 11/11-11/13 because of newly diagnosed diabetes mellitus and DKA. Patient was discharged on Lantus 55 units and NovoLog pre-meals. He states that he could not afford his insulin. He tried to schedule an appointment to establish care with PCP, however he was able to get an appointment until 09/27/15. He has not taken insulin in the past 5 days. He developed generalized weakness and increased urinary frequency. He denies dysuria or burning on urination, nausea, vomiting, diarrhea. He has very mild abdominal pain, no fever, chills, chest pain, shortness of breath, cough, unilateral weakness.  In ED, patient was found to have DKA with AG 19, potassium of 4.5, bicarbonate 17. Urinalysis positive for ketone and small  amount of leukocytes. WBC 9.5, temperature  normal, tachycardia, worsening renal function. The patient's admitted to inpatient for further eval and treatment.   HOSPITAL COURSE:    DKA (diabetic ketoacidoses) ([place]):  pt had an  AG 19 on admission , potassium of 4.5, bicarbonate 17 and positive ketone in urine, consistent with DKA. Mental status is normal. No signs of infection. This is obviously due to insulin noncompliance. Started  patient on NPH 70/30 as the patient could not afford Lantus. cbg stable , DC home today ,     Asthma: stable. -Continue Abulterol Nebs prn  Type 2 diabetes mellitus with stage 3 chronic kidney disease ([place]): Newly diagnosed. Hemoglobin A1c 13.7 . Started on NPH 70/30  AoCKD-III: Previous Cre is 1.49 on 08/25/15, his Cre is 1.72, BUN 28 on admission. Likely due to prerenal secondary to dehydration Improved after IV hydration now 1.25  GERD: -Protonix   HLD: Last LDL was not calculated in recent FLP due to elevated TG which was 425. -Continue home medications: Lipitor  -Check direct LDL   Discharge Exam:   Blood pressure 109/67, pulse 85, temperature 97.9 F (36.6 C), temperature source Oral, resp. rate 14, height 6' (1.829 m), weight 108.5 kg (239 lb 3.2 oz), SpO2 99 %.  Cardiac: S1/S2, RRR, No murmurs, No gallops or rubs. Pulm: Good air movement bilaterally. No rales, wheezing, rhonchi or rubs. Abd: Soft, nondistended, nontender, no rebound pain, no organomegaly, BS present. Ext: No pitting leg edema bilaterally. 2+DP/PT pulse bilaterally. Musculoskeletal: No joint deformities, No joint redness or warmth, no limitation of ROM in spin. Skin: No rashes.  Neuro: Alert, oriented X3, cranial nerves II-XII grossly intact, muscle strength 5/5 in all extremities, sensation to light touch intact. Brachial reflex 2+ bilaterally. Knee reflex 1+ bilaterally. Negative Babinski's sign. Normal finger to nose test.    Follow-up Information    Follow up with [place].   Why:   Appointment Friday, December 16th @ 10:45 a.m.      SignedReyne Dumas 08/31/2015, 10:33 AM        Time spent >45 mins

## 2016-01-19 ENCOUNTER — Emergency Department (HOSPITAL_COMMUNITY)
Admission: EM | Admit: 2016-01-19 | Discharge: 2016-01-19 | Disposition: A | Discharge status: Home / Self Care | Payer: Medicaid Other | Attending: Emergency Medicine | Admitting: Emergency Medicine

## 2016-01-19 ENCOUNTER — Encounter (HOSPITAL_COMMUNITY): Payer: Self-pay | Admitting: Emergency Medicine

## 2016-01-19 DIAGNOSIS — E669 Obesity, unspecified: Secondary | ICD-10-CM | POA: Insufficient documentation

## 2016-01-19 DIAGNOSIS — J45901 Unspecified asthma with (acute) exacerbation: Secondary | ICD-10-CM | POA: Insufficient documentation

## 2016-01-19 DIAGNOSIS — Z794 Long term (current) use of insulin: Secondary | ICD-10-CM | POA: Diagnosis not present

## 2016-01-19 DIAGNOSIS — N183 Chronic kidney disease, stage 3 (moderate): Secondary | ICD-10-CM | POA: Insufficient documentation

## 2016-01-19 DIAGNOSIS — R0602 Shortness of breath: Secondary | ICD-10-CM | POA: Diagnosis present

## 2016-01-19 DIAGNOSIS — E1122 Type 2 diabetes mellitus with diabetic chronic kidney disease: Secondary | ICD-10-CM | POA: Diagnosis not present

## 2016-01-19 MED ORDER — ALBUTEROL SULFATE (2.5 MG/3ML) 0.083% IN NEBU: 2.5000 mg | INHALATION_SOLUTION | Freq: Four times a day (QID) | RESPIRATORY_TRACT | Status: DC | PRN

## 2016-01-19 MED ORDER — ALBUTEROL SULFATE HFA 108 (90 BASE) MCG/ACT IN AERS: 1.0000 | INHALATION_SPRAY | Freq: Four times a day (QID) | RESPIRATORY_TRACT | Status: AC | PRN

## 2016-01-19 MED ORDER — PREDNISONE 20 MG PO TABS: 20.0000 mg | ORAL_TABLET | Freq: Every day | ORAL | Status: DC

## 2016-01-19 MED ORDER — ALBUTEROL SULFATE (2.5 MG/3ML) 0.083% IN NEBU
5.0000 mg | INHALATION_SOLUTION | Freq: Once | RESPIRATORY_TRACT | Status: AC
  Administered 2016-01-19: 5 mg via RESPIRATORY_TRACT
  Filled 2016-01-19: qty 6

## 2016-01-19 MED ORDER — PREDNISONE 20 MG PO TABS
60.0000 mg | ORAL_TABLET | Freq: Once | ORAL | Status: AC
  Administered 2016-01-19: 60 mg via ORAL
  Filled 2016-01-19: qty 3

## 2016-01-19 NOTE — ED Notes (Signed)
POV, asthma flare this AM, URI s/s, no F/V/D, out of meds at home, no pain, no resp distress, A/O X4

## 2016-01-19 NOTE — ED Provider Notes (Signed)
CSN: [id number]     Arrival date & time 01/19/16  0654 History   First MD Initiated Contact with Patient 01/19/16 [phone]     Chief Complaint  Patient presents with  . Asthma     (Consider location/radiation/quality/duration/timing/severity/associated sxs/prior Treatment) HPI Comments: Patient with a history of asthma presents with wheezing and shortness of breath. He states his daughter has a cold and he feels like that was exacerbated his asthma. He states it's been worsening over last 2-3 days. He denies any runny nose or congestion. He has a dry, nonproductive cough. He denies any chest pain. No fevers. No nausea or vomiting. He is currently out of his albuterol inhaler. He uses an albuterol MDI as well as nebulizer at home but he is out of his medication. He does not use preventative medications.  Patient is a 35 y.o. male presenting with asthma.  Asthma Associated symptoms include shortness of breath. Pertinent negatives include no chest pain, no abdominal pain and no headaches.    Past Medical History  Diagnosis Date  . Asthma   . Obesity   . Type 2 diabetes mellitus with stage 3 chronic kidney disease ([place])   . CKD (chronic kidney disease), stage III    Past Surgical History  Procedure Laterality Date  . Cholecystectomy     Family History  Problem Relation Age of Onset  . Diabetes Mother   . Diabetes Father    Social History  Substance Use Topics  . Smoking status: Never Smoker   . Smokeless tobacco: None  . Alcohol Use: No    Review of Systems  Constitutional: Negative for fever, chills, diaphoresis and fatigue.  HENT: Negative for congestion, rhinorrhea and sneezing.   Eyes: Negative.   Respiratory: Positive for cough, shortness of breath and wheezing. Negative for chest tightness.   Cardiovascular: Negative for chest pain and leg swelling.  Gastrointestinal: Negative for nausea, vomiting, abdominal pain, diarrhea and blood in stool.  Genitourinary: Negative for  frequency, hematuria, flank pain and difficulty urinating.  Musculoskeletal: Negative for back pain and arthralgias.  Skin: Negative for rash.  Neurological: Negative for dizziness, speech difficulty, weakness, numbness and headaches.      Allergies  Review of patient's allergies indicates no known allergies.  Home Medications   Prior to Admission medications   Medication Sig Start Date End Date Taking? Authorizing Provider  insulin aspart protamine - aspart (NOVOLOG 70/30 MIX) (70-30) 100 UNIT/ML FlexPen Inject 0.45 mLs (45 Units total) into the skin 2 (two) times daily. 08/31/15  Yes Reyne Dumas, MD  albuterol (PROVENTIL HFA;VENTOLIN HFA) 108 (90 Base) MCG/ACT inhaler Inhale 1-2 puffs into the lungs every 6 (six) hours as needed for wheezing or shortness of breath. 01/19/16   Malvin Johns, MD  albuterol (PROVENTIL) (2.5 MG/3ML) 0.083% nebulizer solution Take 3 mLs (2.5 mg total) by nebulization every 6 (six) hours as needed for wheezing or shortness of breath. 01/19/16   Malvin Johns, MD  atorvastatin (LIPITOR) 40 MG tablet Take 1 tablet (40 mg total) by mouth daily at 6 PM. Patient not taking: Reported on 01/19/2016 08/25/15   Reyne Dumas, MD  Blood Gluc Meter Disp-Strips (BLOOD GLUCOSE METER DISPOSABLE) DEVI 100, check CBG 3 times a day before meals 08/31/15   Reyne Dumas, MD  glucose monitoring kit (FREESTYLE) monitoring kit 1 each by Does not apply route as needed for other. Aviva Accucheck Plus Gluccometer 08/31/15   Reyne Dumas, MD  insulin aspart (NOVOLOG FLEXPEN) 100 UNIT/ML FlexPen Inject 7 Units  into the skin 3 (three) times daily with meals. Patient not taking: Reported on 01/19/2016 08/25/15   Reyne Dumas, MD  Insulin Pen Needle 30G X 5 MM MISC Use pen needles as directed 08/31/15   Reyne Dumas, MD  Lancets (FREESTYLE) lancets check CBG 3 times a day before meals 08/31/15   Reyne Dumas, MD  pantoprazole (PROTONIX) 40 MG tablet Take 1 tablet (40 mg total) by mouth  daily. Patient not taking: Reported on 01/19/2016 08/25/15   Reyne Dumas, MD  predniSONE (DELTASONE) 20 MG tablet Take 1 tablet (20 mg total) by mouth daily with breakfast. 01/19/16   Malvin Johns, MD   BP 132/90 mmHg  Pulse 101  Temp(Src) 97.4 F (36.3 C) (Oral)  Resp 14  Ht 6' (1.829 m)  Wt 255 lb (115.667 kg)  BMI 34.58 kg/m2  SpO2 93% Physical Exam  Constitutional: He is oriented to person, place, and time. He appears well-developed and well-nourished.  HENT:  Head: Normocephalic and atraumatic.  Eyes: Pupils are equal, round, and reactive to light.  Neck: Normal range of motion. Neck supple.  Cardiovascular: Normal rate, regular rhythm and normal heart sounds.   Pulmonary/Chest: Effort normal. No respiratory distress. He has wheezes (Minor bilateral expiratory wheezing. No increased work of breathing). He has no rales. He exhibits no tenderness.  Abdominal: Soft. Bowel sounds are normal. There is no tenderness. There is no rebound and no guarding.  Musculoskeletal: Normal range of motion. He exhibits no edema.  No edema or calf tenderness  Lymphadenopathy:    He has no cervical adenopathy.  Neurological: He is alert and oriented to person, place, and time.  Skin: Skin is warm and dry. No rash noted.  Psychiatric: He has a normal mood and affect.    ED Course  Procedures (including critical care time) Labs Review Labs Reviewed - No data to display  Imaging Review No results found. I have personally reviewed and evaluated these images and lab results as part of my medical decision-making.   EKG Interpretation None      MDM   Final diagnoses:  Asthma exacerbation    Patient presents with wheezing and shortness of breath consistent with his prior asthma exacerbations. He's been out of his home medications. He was given 2 nebulizer treatments here with improvement in symptoms. He's ready to go home. He has no shortness of breath. No increased work of breathing. No  tachypnea or hypoxia. He has no productive cough or fevers which would warrant imaging. He was discharged home in good condition. He was given a prescription for his albuterol inhaler as well as nebulizer solution. He was started on a five-day course of prednisone. He was advised to follow-up with his PCP for ongoing management her return here as needed for any worsening symptoms.    Malvin Johns, MD 01/19/16 [phone]

## 2016-01-19 NOTE — Discharge Instructions (Signed)
Asthma, Adult Asthma is a recurring condition in which the airways tighten and narrow. Asthma can make it difficult to breathe. It can cause coughing, wheezing, and shortness of breath. Asthma episodes, also called asthma attacks, range from minor to life-threatening. Asthma cannot be cured, but medicines and lifestyle changes can help control it. CAUSES Asthma is believed to be caused by inherited (genetic) and environmental factors, but its exact cause is unknown. Asthma may be triggered by allergens, lung infections, or irritants in the air. Asthma triggers are different for each person. Common triggers include:   Animal dander.  Dust mites.  Cockroaches.  Pollen from trees or grass.  Mold.  Smoke.  Air pollutants such as dust, household cleaners, hair sprays, aerosol sprays, paint fumes, strong chemicals, or strong odors.  Cold air, weather changes, and winds (which increase molds and pollens in the air).  Strong emotional expressions such as crying or laughing hard.  Stress.  Certain medicines (such as aspirin) or types of drugs (such as beta-blockers).  Sulfites in foods and drinks. Foods and drinks that may contain sulfites include dried fruit, potato chips, and sparkling grape juice.  Infections or inflammatory conditions such as the flu, a cold, or an inflammation of the nasal membranes (rhinitis).  Gastroesophageal reflux disease (GERD).  Exercise or strenuous activity. SYMPTOMS Symptoms may occur immediately after asthma is triggered or many hours later. Symptoms include:  Wheezing.  Excessive nighttime or early morning coughing.  Frequent or severe coughing with a common cold.  Chest tightness.  Shortness of breath. DIAGNOSIS  The diagnosis of asthma is made by a review of your medical history and a physical exam. Tests may also be performed. These may include:  Lung function studies. These tests show how much air you breathe in and out.  Allergy  tests.  Imaging tests such as X-rays. TREATMENT  Asthma cannot be cured, but it can usually be controlled. Treatment involves identifying and avoiding your asthma triggers. It also involves medicines. There are 2 classes of medicine used for asthma treatment:   Controller medicines. These prevent asthma symptoms from occurring. They are usually taken every day.  Reliever or rescue medicines. These quickly relieve asthma symptoms. They are used as needed and provide short-term relief. Your health care provider will help you create an asthma action plan. An asthma action plan is a written plan for managing and treating your asthma attacks. It includes a list of your asthma triggers and how they may be avoided. It also includes information on when medicines should be taken and when their dosage should be changed. An action plan may also involve the use of a device called a peak flow meter. A peak flow meter measures how well the lungs are working. It helps you monitor your condition. HOME CARE INSTRUCTIONS   Take medicines only as directed by your health care provider. Speak with your health care provider if you have questions about how or when to take the medicines.  Use a peak flow meter as directed by your health care provider. Record and keep track of readings.  Understand and use the action plan to help minimize or stop an asthma attack without needing to seek medical care.  Control your home environment in the following ways to help prevent asthma attacks:  Do not smoke. Avoid being exposed to secondhand smoke.  Change your heating and air conditioning filter regularly.  Limit your use of fireplaces and wood stoves.  Get rid of pests (such as roaches   and mice) and their droppings.  Throw away plants if you see mold on them.  Clean your floors and dust regularly. Use unscented cleaning products.  Try to have someone else vacuum for you regularly. Stay out of rooms while they are  being vacuumed and for a short while afterward. If you vacuum, use a dust mask from a hardware store, a double-layered or microfilter vacuum cleaner bag, or a vacuum cleaner with a HEPA filter.  Replace carpet with wood, tile, or vinyl flooring. Carpet can trap dander and dust.  Use allergy-proof pillows, mattress covers, and box spring covers.  Wash bed sheets and blankets every week in hot water and dry them in a dryer.  Use blankets that are made of polyester or cotton.  Clean bathrooms and kitchens with bleach. If possible, have someone repaint the walls in these rooms with mold-resistant paint. Keep out of the rooms that are being cleaned and painted.  Wash hands frequently. SEEK MEDICAL CARE IF:   You have wheezing, shortness of breath, or a cough even if taking medicine to prevent attacks.  The colored mucus you cough up (sputum) is thicker than usual.  Your sputum changes from clear or white to yellow, green, gray, or bloody.  You have any problems that may be related to the medicines you are taking (such as a rash, itching, swelling, or trouble breathing).  You are using a reliever medicine more than 2-3 times per week.  Your peak flow is still at 50-79% of your personal best after following your action plan for 1 hour.  You have a fever. SEEK IMMEDIATE MEDICAL CARE IF:   You seem to be getting worse and are unresponsive to treatment during an asthma attack.  You are short of breath even at rest.  You get short of breath when doing very little physical activity.  You have difficulty eating, drinking, or talking due to asthma symptoms.  You develop chest pain.  You develop a fast heartbeat.  You have a bluish color to your lips or fingernails.  You are light-headed, dizzy, or faint.  Your peak flow is less than 50% of your personal best.   This information is not intended to replace advice given to you by your health care provider. Make sure you discuss any  questions you have with your health care provider.   Document Released: 09/28/2005 Document Revised: 06/19/2015 Document Reviewed: 04/27/2013 Elsevier Interactive Patient Education 2016 [employer]  

## 2016-01-28 ENCOUNTER — Encounter (HOSPITAL_COMMUNITY): Payer: Self-pay

## 2016-01-28 ENCOUNTER — Emergency Department (HOSPITAL_COMMUNITY)
Admission: EM | Admit: 2016-01-28 | Discharge: 2016-01-29 | Disposition: A | Discharge status: Home / Self Care | Payer: Medicaid Other | Attending: Emergency Medicine | Admitting: Emergency Medicine

## 2016-01-28 DIAGNOSIS — Z794 Long term (current) use of insulin: Secondary | ICD-10-CM | POA: Insufficient documentation

## 2016-01-28 DIAGNOSIS — J45901 Unspecified asthma with (acute) exacerbation: Secondary | ICD-10-CM | POA: Insufficient documentation

## 2016-01-28 DIAGNOSIS — E119 Type 2 diabetes mellitus without complications: Secondary | ICD-10-CM | POA: Diagnosis not present

## 2016-01-28 DIAGNOSIS — E669 Obesity, unspecified: Secondary | ICD-10-CM | POA: Insufficient documentation

## 2016-01-28 DIAGNOSIS — N183 Chronic kidney disease, stage 3 (moderate): Secondary | ICD-10-CM | POA: Diagnosis not present

## 2016-01-28 DIAGNOSIS — R0600 Dyspnea, unspecified: Secondary | ICD-10-CM

## 2016-01-28 DIAGNOSIS — Z79899 Other long term (current) drug therapy: Secondary | ICD-10-CM | POA: Diagnosis not present

## 2016-01-28 DIAGNOSIS — R062 Wheezing: Secondary | ICD-10-CM | POA: Diagnosis present

## 2016-01-28 MED ORDER — PREDNISONE 20 MG PO TABS
60.0000 mg | ORAL_TABLET | ORAL | Status: AC
  Administered 2016-01-29: 60 mg via ORAL
  Filled 2016-01-28: qty 3

## 2016-01-28 MED ORDER — ALBUTEROL SULFATE (2.5 MG/3ML) 0.083% IN NEBU
5.0000 mg | INHALATION_SOLUTION | Freq: Once | RESPIRATORY_TRACT | Status: AC
  Administered 2016-01-28: 5 mg via RESPIRATORY_TRACT

## 2016-01-28 MED ORDER — PREDNISONE 20 MG PO TABS: 40.0000 mg | ORAL_TABLET | Freq: Every day | ORAL | Status: AC

## 2016-01-28 MED ORDER — BECLOMETHASONE DIPROPIONATE 40 MCG/ACT IN AERS
1.0000 | INHALATION_SPRAY | Freq: Two times a day (BID) | RESPIRATORY_TRACT | Status: DC
  Administered 2016-01-29: 1 via RESPIRATORY_TRACT
  Filled 2016-01-28: qty 8.7

## 2016-01-28 MED ORDER — ALBUTEROL SULFATE (2.5 MG/3ML) 0.083% IN NEBU: 2.5000 mg | INHALATION_SOLUTION | Freq: Four times a day (QID) | RESPIRATORY_TRACT | Status: AC | PRN

## 2016-01-28 MED ORDER — ALBUTEROL SULFATE (2.5 MG/3ML) 0.083% IN NEBU
INHALATION_SOLUTION | RESPIRATORY_TRACT | Status: DC
Start: 2016-01-28 — End: 2016-01-29
  Filled 2016-01-28: qty 6

## 2016-01-28 NOTE — Discharge Instructions (Signed)
As discussed, your evaluation today has been largely reassuring.  But, it is important that you monitor your condition carefully, and do not hesitate to return to the ED if you develop new, or concerning changes in your condition.  In addition to the prescribed prednisone, please use your albuterol nebulizer every four hours for the next three days.  Also, please use the provided QVar as prescribed.

## 2016-01-28 NOTE — ED Notes (Signed)
Pt seen several weeks ago for cough/wheezing and was prescribed inhaler.  Pt reports Ventolin works better than the Proventil that was prescribed.  Pt having cough, wheezing tonight.  Pt talking in complete sentences, NAD at triage.

## 2016-01-28 NOTE — ED Provider Notes (Signed)
CSN: [id number]     Arrival date & time 01/28/16  2239 History   First MD Initiated Contact with Patient 01/28/16 2309     Chief Complaint  Patient presents with  . Asthma   HPI Patient presents with concern of ongoing dyspnea. Symptoms were present for some time, worse with the past few days. Patient has a history of asthma, was actually here about 10 days ago. He notes that following completion of a short course of steroids, he was transiently better, but is now worse again. No other new concerns, including no fever, chest pain, nausea, vomiting. Patient has previously had improvement with inhaled steroids, but does not have access to that medication currently. He is using albuterol nebulizer regular, without change in his condition. Past Medical History  Diagnosis Date  . Asthma   . Obesity   . Type 2 diabetes mellitus with stage 3 chronic kidney disease ([place])   . CKD (chronic kidney disease), stage III    Past Surgical History  Procedure Laterality Date  . Cholecystectomy     Family History  Problem Relation Age of Onset  . Diabetes Mother   . Diabetes Father    Social History  Substance Use Topics  . Smoking status: Never Smoker   . Smokeless tobacco: None  . Alcohol Use: No    Review of Systems  Constitutional:       Per HPI, otherwise negative  HENT:       Per HPI, otherwise negative  Respiratory:       Per HPI, otherwise negative  Cardiovascular:       Per HPI, otherwise negative  Gastrointestinal: Negative for vomiting.  Endocrine:       Negative aside from HPI  Genitourinary:       Neg aside from HPI   Musculoskeletal:       Per HPI, otherwise negative  Skin: Negative.   Neurological: Negative for syncope.      Allergies  Review of patient's allergies indicates no known allergies.  Home Medications   Prior to Admission medications   Medication Sig Start Date End Date Taking? Authorizing Provider  albuterol (PROVENTIL HFA;VENTOLIN HFA) 108  (90 Base) MCG/ACT inhaler Inhale 1-2 puffs into the lungs every 6 (six) hours as needed for wheezing or shortness of breath. 01/19/16   Malvin Johns, MD  albuterol (PROVENTIL) (2.5 MG/3ML) 0.083% nebulizer solution Take 3 mLs (2.5 mg total) by nebulization every 6 (six) hours as needed for wheezing or shortness of breath. 01/28/16   Carmin Muskrat, MD  atorvastatin (LIPITOR) 40 MG tablet Take 1 tablet (40 mg total) by mouth daily at 6 PM. Patient not taking: Reported on 01/19/2016 08/25/15   Reyne Dumas, MD  Blood Gluc Meter Disp-Strips (BLOOD GLUCOSE METER DISPOSABLE) DEVI 100, check CBG 3 times a day before meals 08/31/15   Reyne Dumas, MD  glucose monitoring kit (FREESTYLE) monitoring kit 1 each by Does not apply route as needed for other. Aviva Accucheck Plus Gluccometer 08/31/15   Reyne Dumas, MD  insulin aspart (NOVOLOG FLEXPEN) 100 UNIT/ML FlexPen Inject 7 Units into the skin 3 (three) times daily with meals. Patient not taking: Reported on 01/19/2016 08/25/15   Reyne Dumas, MD  insulin aspart protamine - aspart (NOVOLOG 70/30 MIX) (70-30) 100 UNIT/ML FlexPen Inject 0.45 mLs (45 Units total) into the skin 2 (two) times daily. 08/31/15   Reyne Dumas, MD  Insulin Pen Needle 30G X 5 MM MISC Use pen needles as directed 08/31/15  Reyne Dumas, MD  Lancets (FREESTYLE) lancets check CBG 3 times a day before meals 08/31/15   Reyne Dumas, MD  pantoprazole (PROTONIX) 40 MG tablet Take 1 tablet (40 mg total) by mouth daily. Patient not taking: Reported on 01/19/2016 08/25/15   Reyne Dumas, MD  predniSONE (DELTASONE) 20 MG tablet Take 2 tablets (40 mg total) by mouth daily with breakfast. 01/29/16 02/01/16  Carmin Muskrat, MD   BP 148/41 mmHg  Pulse 86  Temp(Src) 97.9 F (36.6 C) (Oral)  Resp 16  Ht 6' (1.829 m)  Wt 258 lb 6.4 oz (117.209 kg)  BMI 35.04 kg/m2  SpO2 96% Physical Exam  Constitutional: He is oriented to person, place, and time. He appears well-developed. No distress.  HENT:  Head:  Normocephalic and atraumatic.  Eyes: Conjunctivae and EOM are normal.  Cardiovascular: Normal rate and regular rhythm.   Pulmonary/Chest: Effort normal. No stridor. No respiratory distress. He has wheezes.  Abdominal: He exhibits no distension.  Musculoskeletal: He exhibits no edema.  Neurological: He is alert and oriented to person, place, and time.  Skin: Skin is warm and dry.  Psychiatric: He has a normal mood and affect.  Nursing note and vitals reviewed.   ED Course  Procedures (including critical care time) Chart review notable for visit one week ago, similar condition. Here, the patient is awake, alert, no hypoxia, evidence for respiratory compromise. Patient was provided via metered inhaler with inhaled steroid. Also provided additional oral steroid.   MDM   Final diagnoses:  Dyspnea   With asthma presents with concern of ongoing mild dyspnea, wheezing. Here, he is in no respiratory distress. Patient received steroids, inhaled, oral, in addition to bronchodilator, albuterol.  Patient discharged in stable condition to follow-up with his primary care physician.  Carmin Muskrat, MD 01/29/16 0001

## 2017-03-28 IMAGING — US US RENAL
1 series · 14 of 25 positions shown · non-contrast
Comparison: None.

CLINICAL DATA: Acute kidney injury

EXAM:
RENAL / URINARY TRACT ULTRASOUND COMPLETE

[Series 1: us renal · 0.28mm/px · 14 of 37 slices shown]
[im 1/37]
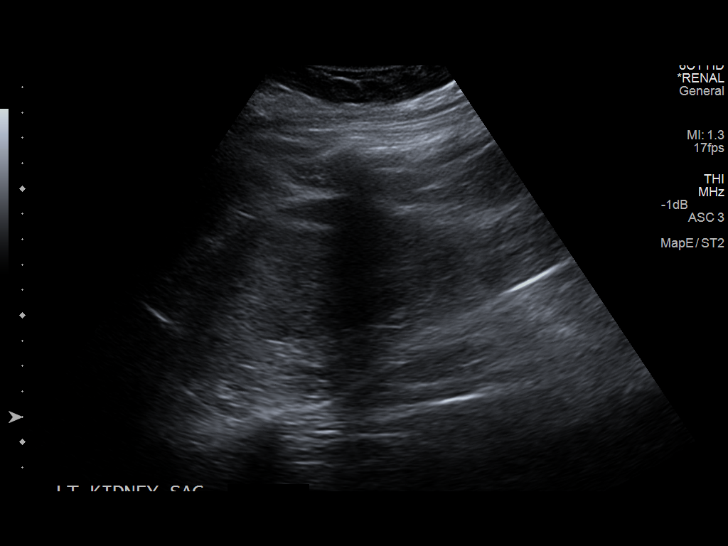
[im 4/37]
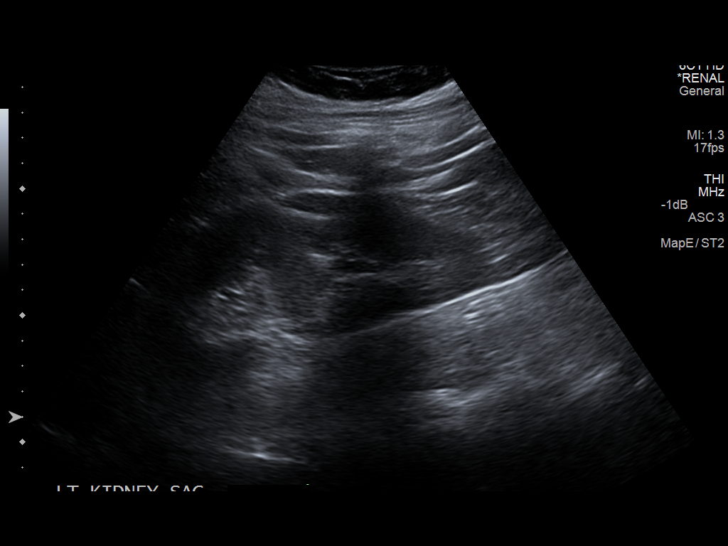
[im 7/37]
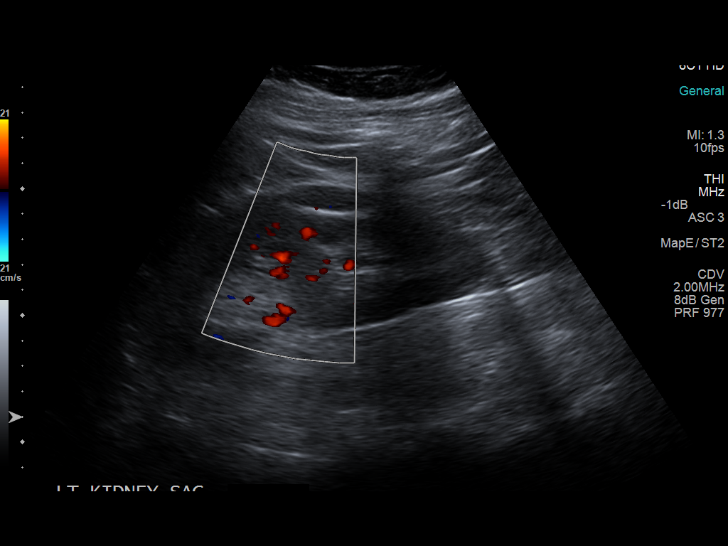
[im 10/37]
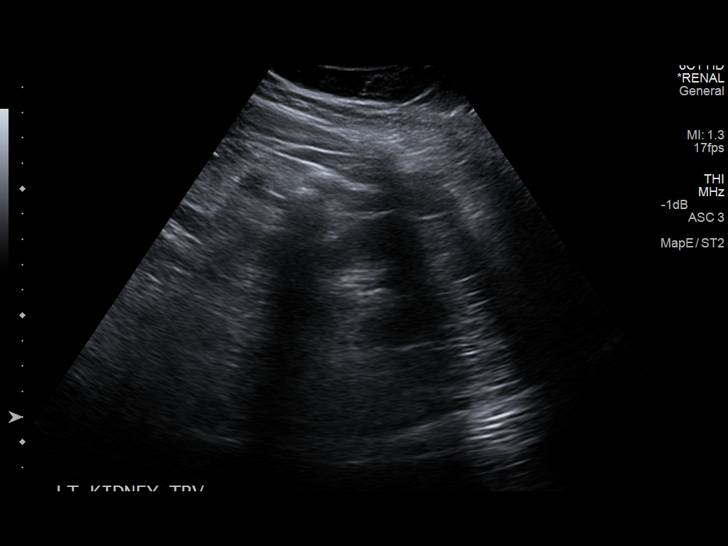
[im 13/37]
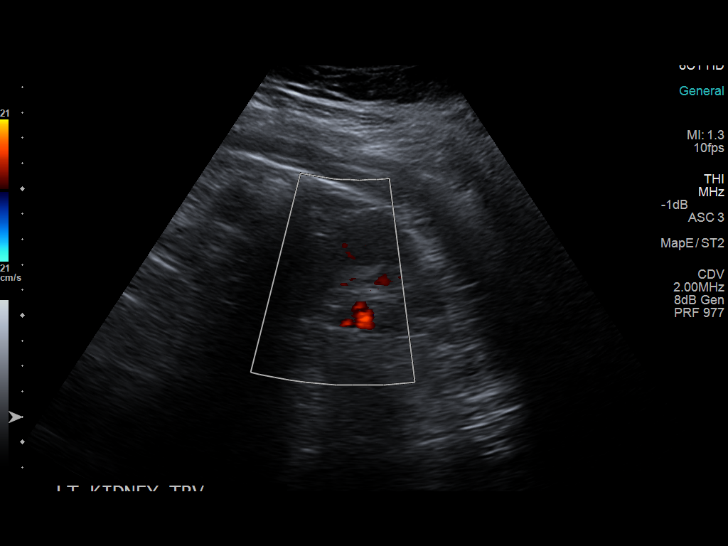
[im 14/37]
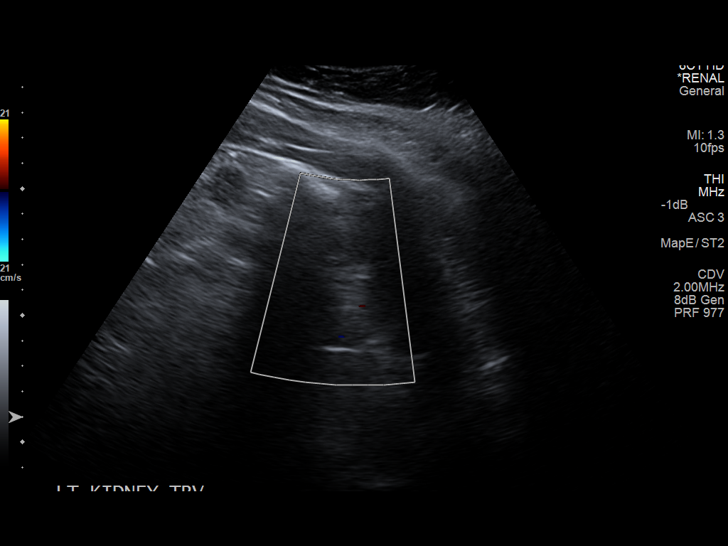
[im 17/37]
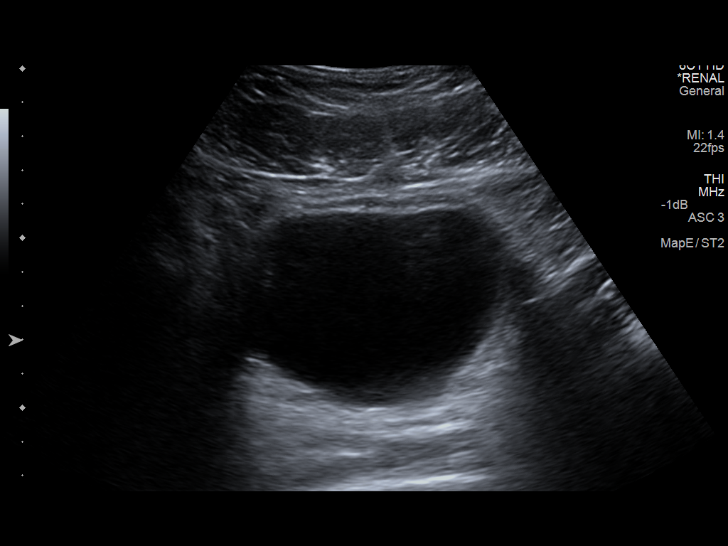
[im 20/37]
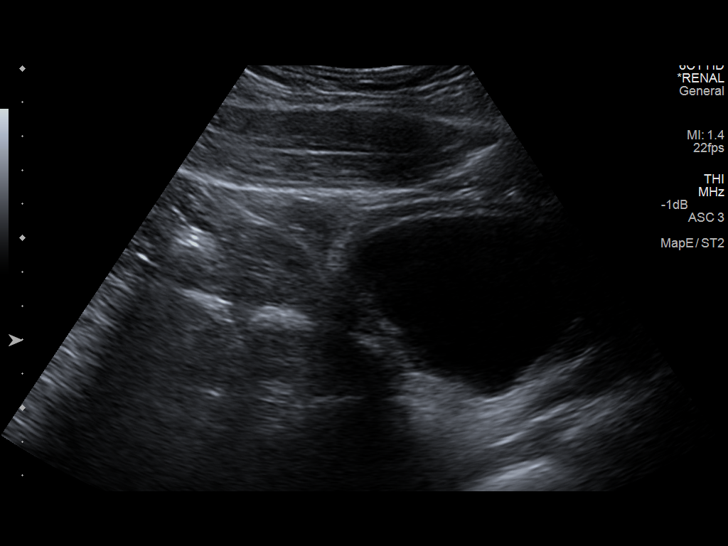
[im 23/37]
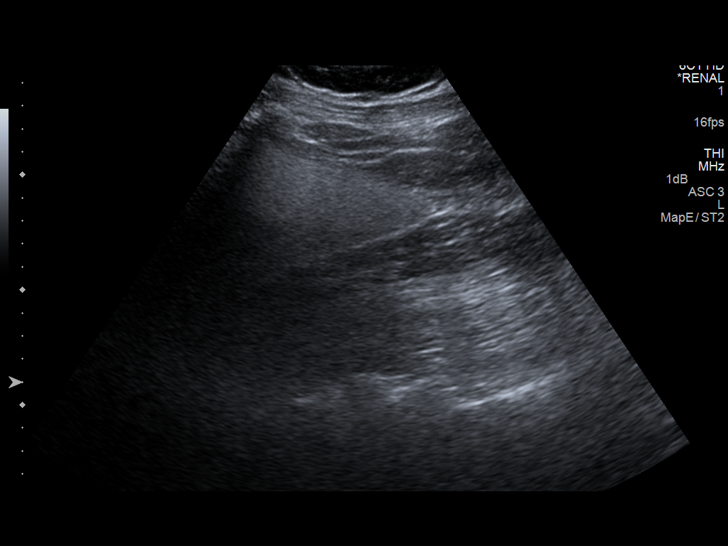
[im 25/37]
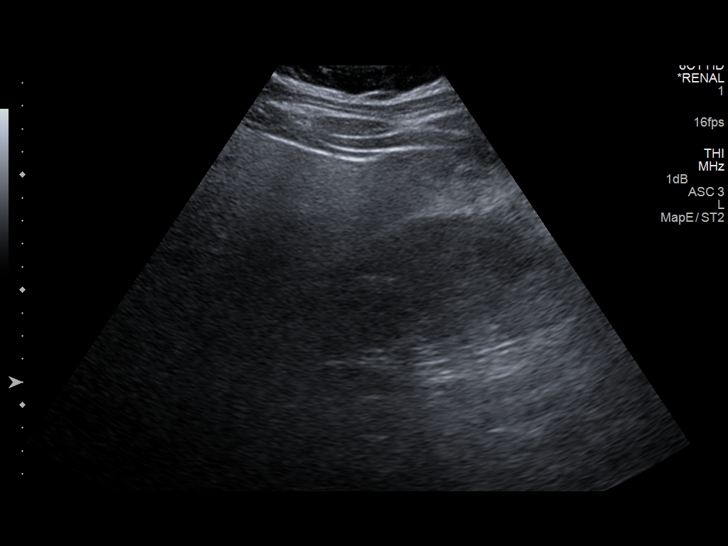
[im 28/37]
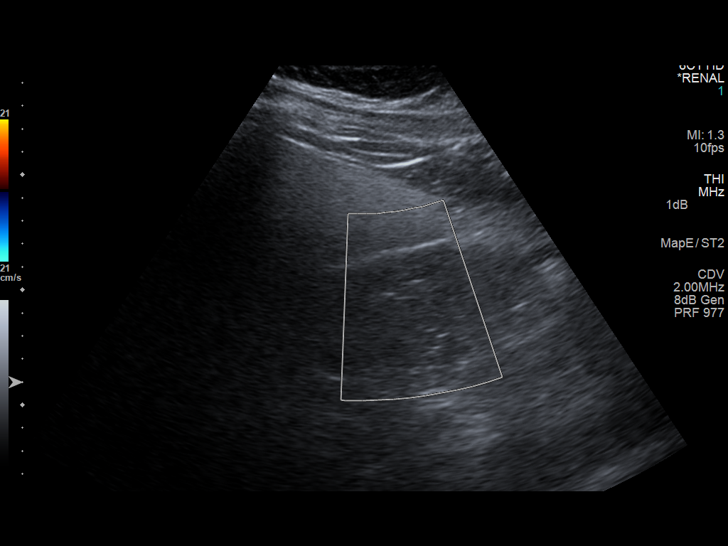
[im 31/37]
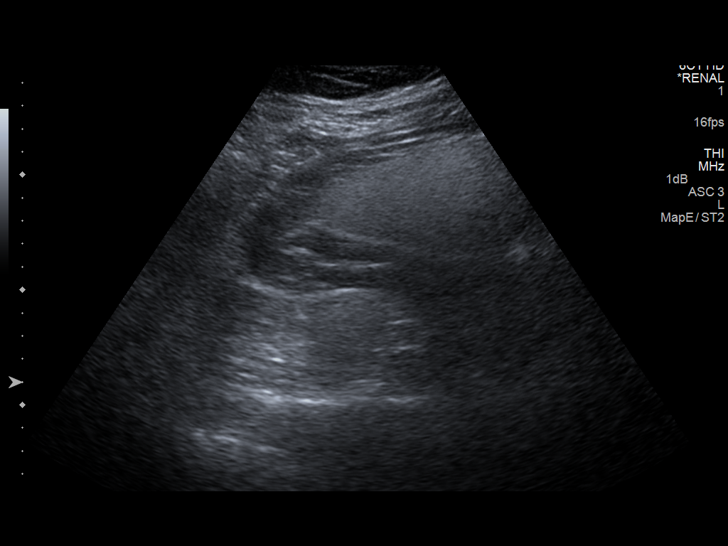
[im 34/37]
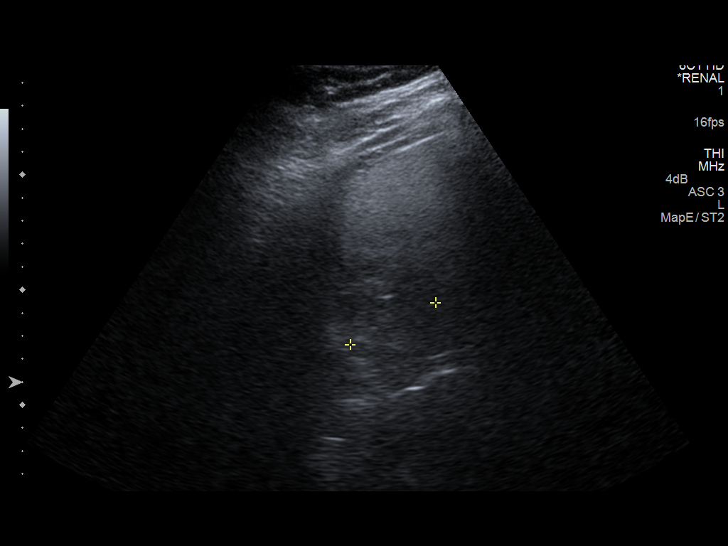
[im 37/37]
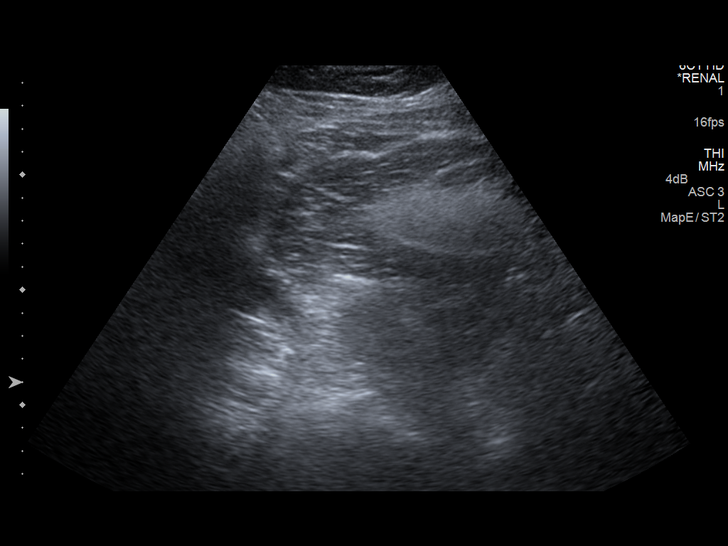

[14 of 25 positions shown; findings below may reference images not displayed]

FINDINGS: Right Kidney:

Length: 11.7 cm. Suboptimally visualized. No mass or hydronephrosis.

Left Kidney:

Length: 13.1 cm. Upper pole is suboptimally visualized. No mass or
hydronephrosis.

Bladder:

Within normal limits.
IMPRESSION: No hydronephrosis.

## 2021-06-09 ENCOUNTER — Ambulatory Visit: Admit: 2021-06-09 | Discharge: 2021-06-09 | Attending: Family Medicine | Primary: Family Medicine

## 2021-06-09 ENCOUNTER — Ambulatory Visit: Attending: Family Medicine | Primary: Family Medicine

## 2021-06-09 DIAGNOSIS — E1149 Type 2 diabetes mellitus with other diabetic neurological complication: Secondary | ICD-10-CM

## 2021-06-09 LAB — AMB POC HEMOGLOBIN A1C
Hemoglobin A1C, POC: 14.7 %
Hemoglobin A1c (POC): 14.7 %

## 2021-06-09 LAB — AMB POC GLUCOSE, QUANTITATIVE, BLOOD
Glucose POC: 422 MG/DL
Glucose, POC: 422 MG/DL

## 2021-06-09 MED ORDER — EMPAGLIFLOZIN 10 MG TABLET
10 mg | ORAL_TABLET | Freq: Every day | ORAL | 3 refills | Status: DC
Start: 2021-06-09 — End: 2021-10-15

## 2021-06-09 MED ORDER — BLOOD SUGAR DIAGNOSTIC TEST STRIPS
ORAL_STRIP | 5 refills | Status: AC
Start: 2021-06-09 — End: ?

## 2021-06-09 MED ORDER — INSULIN NPH/INSULIN REGULAR (70/30) 100 UNIT/ML INJECTION
10070-30 unit/mL (70-30) | Freq: Two times a day (BID) | SUBCUTANEOUS | 0 refills | Status: AC
Start: 2021-06-09 — End: 2021-07-21

## 2021-06-09 MED ORDER — INSULIN NEEDLES (DISPOSABLE) 31 X 5/16"
31 gauge x 5/16" | 11 refills | Status: AC
Start: 2021-06-09 — End: ?

## 2021-06-09 MED ORDER — LANCETS
11 refills | Status: AC
Start: 2021-06-09 — End: ?

## 2021-06-09 MED ORDER — BLOOD GLUCOSE METER KIT
PACK | 0 refills | Status: AC
Start: 2021-06-09 — End: ?

## 2021-06-09 NOTE — Progress Notes (Signed)
Name and DOB Verified.     Pharmacy verified     Previous PCP: No Previous PCP    Chief Complaint   Patient presents with    New Patient    Establish Care     Patient not fasting, [facility].     Was taking insulin for Type II Diabetes 3 years ago but stopped because he weighed 275 lbs. Hasn't had labs checked in 3 years.    1. Have you been to the ER, urgent care clinic since your last visit?  Hospitalized since your last visit?    Yes  03/2021  [facility] [place]  [facility]     2. Have you seen or consulted any other health care providers outside of the [facility] since your last visit?  Include any pap smears or colon screening. No      Health Maintenance Due   Topic Date Due    Hepatitis C Screening  Never done    Depression Screen  Never done    COVID-19 Vaccine (1) Never done    DTaP/Tdap/Td series (1 - Tdap) Never done    Lipid Screen  Never done

## 2021-06-09 NOTE — Progress Notes (Signed)
[facility]  [phone] [place] [address]  [phone]    C/C: Diabetes    HPI:    Ryan Monroe is a 40 y.o.  BLACK/AFR[place] AMER[place]  male who presents to clinic today for evaluation of the issues listed above. Pt is new to the practice.    Previous pcp: None for 3 years.      Subjective;    Patient presenting for initial office visit with me today.    Significant PMHx include: Diabetes    Diabetes:  Diagnosed about 6 years ago (~2016).  States that at the time of diagnosis his blood glucose was in the 1200s for which he was admitted in [place].  Patient was on Metformin and insulin. States that he initially stopped Metformin due to stomach upset.  He was on NPH because Lantus and other long acting insulin not covered by his insurance.  Recall taking close to 40 units twice daily at some point.  Has not been on any insulin for about 3 years after loosing significant amount of weight (was 275 lbs and nomw 180 lbs).  Has since not been to a doctor.     Pt denies any  fever, chill, chest pain, SOB, abdominal pain, n/v/d, HA or dizziness.     Other Health Habits and social history:  Smoking history: no  Alcohol history:no  Occupation:works at [employer]  Marital status: Patient has 2 children. Not married.    Allergies- reviewed:   No Known Allergies    Past Medical History- reviewed:  Past Medical History:   Diagnosis Date    Asthma     Diabetes ([place])        Family History - reviewed:  Family History   Problem Relation Age of Onset    Hypertension Mother     Asthma Mother     No Known Problems Father     No Known Problems Sister     No Known Problems Sister     No Known Problems Sister     No Known Problems Sister     No Known Problems Sister     No Known Problems Sister     No Known Problems Brother        Social History - reviewed:  Social History     Socioeconomic History    Marital status: UNKNOWN     Spouse name: Not on file    Number of  children: Not on file    Years of education: Not on file    Highest education level: Not on file   Occupational History    Not on file   Tobacco Use    Smoking status: Never     Passive exposure: Never    Smokeless tobacco: Never   Vaping Use    Vaping Use: Never used   Substance and Sexual Activity    Alcohol use: Yes     Comment: Socila    Drug use: Never    Sexual activity: Not Currently   Other Topics Concern    Not on file   Social History Narrative    Not on file     Social Determinants of Health     Financial Resource Strain: Not on file   Food Insecurity: Not on file   Transportation Needs: Not on file   Physical Activity: Not on file   Stress: Not on file   Social Connections: Not on  file   Intimate Partner Violence: Not on file   Housing Stability: Not on file       Depression screening:  3 most recent PHQ Screens 06/09/2021   Little interest or pleasure in doing things Not at all   Feeling down, depressed, irritable, or hopeless Not at all   Total Score PHQ 2 0   Trouble falling or staying asleep, or sleeping too much Not at all   Feeling tired or having little energy Not at all   Poor appetite, weight loss, or overeating Not at all   Feeling bad about yourself - or that you are a failure or have let yourself or your family down Not at all   Trouble concentrating on things such as school, work, reading, or watching TV Not at all   Moving or speaking so slowly that other people could have noticed; or the opposite being so fidgety that others notice Not at all   Thoughts of being better off dead, or hurting yourself in some way Not at all   PHQ 9 Score 0   How difficult have these problems made it for you to do your work, take care of your home and get along with others Not difficult at all         Review of systems:     A comprehensive review of systems was negative except for that written in the History of Present Illness.       Visit Vitals  BP 109/77 (BP 1 Location: Right arm, BP Patient Position:  Sitting, BP Cuff Size: Adult)   Pulse (!) 103   Temp 98 ??F (36.7 ??C) (Oral)   Resp 17   Ht 5' 10.5" (1.791 m)   Wt 180 lb (81.6 kg)   SpO2 96%   BMI 25.46 kg/m??       General: Alert and oriented, in no acute distress. Well nourished.   EYE: PERRL. Sclera and conjuctival clear. Extraocular movements intact.  EARS: External normal, canals clear, tympanic membranes normal.   NOSE: Mucosa healthy without drainage or ulceration.  OROPHARYNX: No suspicious lesions, normal dentition, pharynx, tongue and tonsils normal.  NECK: Supple; no masses; thyroid normal.  LUNGS: Respirations unlabored; clear to auscultation bilaterally.  CARDIOVASCULAR: Regular, rate, and rhythm without murmurs, gallops or rubs.  ABDOMEN: Soft; nontender; nondistended; normoactive bowel sounds; no masses or organomegaly.  MUSCULOSKELETAL: FROM in all extremities     EXT: No edema. Neurovascularlly intact. Normal gait.  SKIN: No rash. No suspicious lesions or moles.  Neuro: Mental Status: Pt is alert and oriented to person, place, and time.      Assessment/Plan       ICD-10-CM ICD-9-CM    1. Type II or unspecified type diabetes mellitus with neurological manifestations, uncontrolled(250.62) (HCC)  E11.49 250.62 AMB POC HEMOGLOBIN A1C      AMB POC GLUCOSE, QUANTITATIVE, BLOOD      MICROALBUMIN, UR, RAND W/ MICROALB/CREAT RATIO      METABOLIC PANEL, COMPREHENSIVE      REFERRAL TO ENDOCRINOLOGY      insulin NPH/insulin regular (NOVOLIN 70/30, HUMULIN 70/30) 100 unit/mL (70-30) injection      empagliflozin (JARDIANCE) 10 mg tablet      Blood-Glucose Meter monitoring kit      lancets misc      glucose blood VI test strips strip      Insulin Needles, Disposable, 31 gauge x 5/16" ndle      METABOLIC PANEL, COMPREHENSIVE  MICROALBUMIN, UR, RAND W/ MICROALB/CREAT RATIO      2. Mixed hyperlipidemia  E78.2 272.2 LIPID PANEL      LIPID PANEL        1. Type II or unspecified type diabetes mellitus with neurological manifestations, uncontrolled(250.62)  (HCC)    Lab Results   Component Value Date/Time    Hemoglobin A1c (POC) 14.7 06/09/2021 03:23 PM     Has not been in medications nor followed for at least 3 years.   Cannot tolerate Metformin.  Lantus not covered by his insurance.   Will start NPH and Jardiance (if covered).    - Continue home monitoring. Glucose goals; Fasting (80-130), preprandial (<140), 2-hr post-prandial (<180)    - AMB POC GLUCOSE, QUANTITATIVE, BLOOD  - MICROALBUMIN, UR, RAND W/ MICROALB/CREAT RATIO; Future  - METABOLIC PANEL, COMPREHENSIVE; Future  - REFERRAL TO ENDOCRINOLOGY  - start insulin NPH/insulin regular (NOVOLIN 70/30, HUMULIN 70/30) 100 unit/mL (70-30) injection; 10 Units by SubCUTAneous route Before breakfast and dinner.    - empagliflozin (JARDIANCE) 10 mg tablet; Take 1 Tablet by mouth daily.   - Blood-Glucose Meter monitoring kit; Check blood sugar daily.  - lancets misc; Use one new lancet per blood glucose check.  - glucose blood VI test strips strip; Use one new test strip per blood glucose check.  - Insulin Needles, Disposable, 31 gauge x 5/16" ndle; Use one needle per insulin injection      2. Mixed hyperlipidemia  Will consider statin at next visit.   - LIPID PANEL; Future      Follow up: 6 weeks or sooner if needed    I have discussed the diagnosis with the patient and the intended plan as seen in the above orders.  The patient has received an after-visit summary and questions were answered concerning future plans.  I have discussed medication side effects and warnings with the patient as well. Informed patient to return to the office if new symptoms arise.    Signed By: Clearence Ped, MD     June 09, 2021

## 2021-06-10 LAB — METABOLIC PANEL, COMPREHENSIVE
A-G Ratio: 0.8 — ABNORMAL LOW (ref 1.1–2.2)
ALT (SGPT): 36 U/L (ref 12–78)
AST (SGOT): 22 U/L (ref 15–37)
Albumin: 3.6 g/dL (ref 3.5–5.0)
Alk. phosphatase: 105 U/L (ref 45–117)
Anion gap: 9 mmol/L (ref 5–15)
BUN/Creatinine ratio: 10 — ABNORMAL LOW (ref 12–20)
BUN: 12 MG/DL (ref 6–20)
Bilirubin, total: 0.3 MG/DL (ref 0.2–1.0)
CO2: 23 mmol/L (ref 21–32)
Calcium: 9.3 MG/DL (ref 8.5–10.1)
Chloride: 105 mmol/L (ref 97–108)
Creatinine: 1.23 MG/DL (ref 0.70–1.30)
GFR est AA: >60 mL/min/{1.73_m2} (ref >60)
GFR est non-AA: >60 mL/min/{1.73_m2} (ref >60)
Globulin: 4.4 g/dL — ABNORMAL HIGH (ref 2.0–4.0)
Glucose: 383 mg/dL — ABNORMAL HIGH (ref 65–100)
Potassium: 3.9 mmol/L (ref 3.5–5.1)
Protein, total: 8 g/dL (ref 6.4–8.2)
Sodium: 137 mmol/L (ref 136–145)

## 2021-06-10 LAB — LIPID PANEL
CHOL/HDL Ratio: 3.4 (ref 0.0–5.0)
Chol/HDL Ratio: 3.4 (ref 0.0–5.0)
Cholesterol, Total: 265 MG/DL — ABNORMAL HIGH (ref <200)
Cholesterol, total: 265 MG/DL — ABNORMAL HIGH (ref <200)
HDL Cholesterol: 77 MG/DL
HDL: 77 MG/DL
LDL Calculated: 149 MG/DL — ABNORMAL HIGH (ref 0–100)
LDL, calculated: 149 MG/DL — ABNORMAL HIGH (ref 0–100)
Triglyceride: 195 MG/DL — ABNORMAL HIGH (ref <150)
Triglycerides: 195 MG/DL — ABNORMAL HIGH (ref <150)
VLDL Cholesterol Calculated: 39 MG/DL
VLDL, calculated: 39 MG/DL

## 2021-06-10 LAB — MICROALBUMIN, UR, RAND W/ MICROALB/CREAT RATIO
Creatinine, urine random: 90.7 mg/dL
Microalbumin,urine random: 1.91 MG/DL
Microalbumin/Creat ratio (mg/g creat): 21 mg/g (ref 0–30)

## 2021-06-10 LAB — COMPREHENSIVE METABOLIC PANEL
ALT: 36 U/L (ref 12–78)
AST: 22 U/L (ref 15–37)
Albumin/Globulin Ratio: 0.8 — ABNORMAL LOW (ref 1.1–2.2)
Albumin: 3.6 g/dL (ref 3.5–5.0)
Alkaline Phosphatase: 105 U/L (ref 45–117)
Anion Gap: 9 mmol/L (ref 5–15)
BUN: 12 MG/DL (ref 6–20)
Bun/Cre Ratio: 10 — ABNORMAL LOW (ref 12–20)
CO2: 23 mmol/L (ref 21–32)
Calcium: 9.3 MG/DL (ref 8.5–10.1)
Chloride: 105 mmol/L (ref 97–108)
Creatinine: 1.23 MG/DL (ref 0.70–1.30)
EGFR IF NonAfrican American: >60 mL/min/{1.73_m2} (ref >60)
GFR African American: >60 mL/min/{1.73_m2} (ref >60)
Globulin: 4.4 g/dL — ABNORMAL HIGH (ref 2.0–4.0)
Glucose: 383 mg/dL — ABNORMAL HIGH (ref 65–100)
Potassium: 3.9 mmol/L (ref 3.5–5.1)
Sodium: 137 mmol/L (ref 136–145)
Total Bilirubin: 0.3 MG/DL (ref 0.2–1.0)
Total Protein: 8 g/dL (ref 6.4–8.2)

## 2021-06-10 LAB — MICROALBUMIN / CREATININE URINE RATIO
Creatinine, Ur: 90.7 mg/dL
Microalb, Ur: 1.91 MG/DL
Microalbumin Creatinine Ratio: 21 mg/g (ref 0–30)

## 2021-06-25 NOTE — Telephone Encounter (Signed)
albuterol sulfate (PROAIR RESPICLICK) 90 mcg/actuation breath activated.    Sig: Take 2 Puffs by inhalation every four (4) hours as needed for Wheezing    Last Filled: Unknown Date/ Previous PCP    Last Filled: 06/09/2021      Next Visit: 07/21/2021

## 2021-06-26 MED ORDER — ALBUTEROL SULFATE 90 MCG/ACTUATION BREATH ACTIVATED POWDER INHALER
90 mcg/actuation | RESPIRATORY_TRACT | 5 refills | Status: DC | PRN
Start: 2021-06-26 — End: 2021-06-27

## 2021-06-27 ENCOUNTER — Encounter

## 2021-06-27 MED ORDER — ALBUTEROL SULFATE HFA 90 MCG/ACTUATION AEROSOL INHALER
90 mcg/actuation | Freq: Four times a day (QID) | RESPIRATORY_TRACT | 5 refills | Status: DC | PRN
Start: 2021-06-27 — End: 2021-11-17

## 2021-06-27 NOTE — Telephone Encounter (Signed)
[facility] sent fax requesting an alternative medication for proair respiclik due to insurance not covering medication. Alternatives not given.       For Pharmacy Admin Tracking Only    CPA in place:   Recommendation Provided To:   Intervention Detail: New Rx: 1, reason: Patient Preference  Gap Closed?:   Intervention Accepted By:   Time Spent (min): 5

## 2021-07-02 NOTE — Progress Notes (Signed)
Initiated PA [id number]  albuterol (PROVENTIL HFA, VENTOLIN HFA, PROAIR HFA) 90 mcg/actuation inhaler     Denied 07/09/2021

## 2021-07-21 ENCOUNTER — Ambulatory Visit: Admit: 2021-07-21 | Discharge: 2021-07-22 | Payer: MEDICAID | Attending: Family Medicine | Primary: Family Medicine

## 2021-07-21 ENCOUNTER — Ambulatory Visit: Attending: Family Medicine | Primary: Family Medicine

## 2021-07-21 DIAGNOSIS — E0865 Diabetes mellitus due to underlying condition with hyperglycemia: Secondary | ICD-10-CM

## 2021-07-21 LAB — AMB POC HEMOGLOBIN A1C
Hemoglobin A1C, POC: 11.6 %
Hemoglobin A1c (POC): 11.6 %

## 2021-07-21 MED ORDER — INSULIN NPH/INSULIN REGULAR (70/30) 100 UNIT/ML INJECTION
100 unit/mL (70-30) | Freq: Two times a day (BID) | SUBCUTANEOUS | 5 refills | Status: DC
Start: 2021-07-21 — End: 2021-12-10

## 2021-07-21 NOTE — Progress Notes (Signed)
 Progress Notes by Pyzowski, Nancy K, LPN at 89/89/77 1600                Author: Pyzowski, Nancy K, LPN  Service: --  Author Type: Licensed Nurse       Filed: 07/21/21 1709  Encounter Date: 07/21/2021  Status: Signed          Editor: Pyzowski, Inocente POUR, LPN (Licensed Nurse)               No chief complaint on file.         1. Have you been to the ER, urgent care clinic since your last visit?  Hospitalized since your last visit? No      2. Have you seen or consulted any other health care providers outside of the [facility] since your last visit? No       3. For patients aged 53-75: Has the patient had a colonoscopy / FIT/ Cologuard? Yes - no Care Gap present         If the patient is male:      4. For patients aged 15-74: Has the patient had a mammogram within the past 2 years? NA - based on age or sex         15. For patients aged 21-65: Has the patient had a pap smear? NA - based on age or sex        Health Maintenance Due        Topic  Date Due         ?  Hepatitis C Screening   Never done     ?  COVID-19 Vaccine (1)  Never done     ?  Foot Exam Q1   Never done     ?  Eye Exam Retinal or Dilated   Never done     ?  DTaP/Tdap/Td series (1 - Tdap)  Never done         ?  Flu Vaccine (1)  Never done

## 2021-07-21 NOTE — Progress Notes (Signed)
Progress Notes by Rene Paci, MD at 07/21/21 1600                Author: Rene Paci, MD  Service: --  Author Type: Physician       Filed: 07/21/21 1709  Encounter Date: 07/21/2021  Status: Signed          Editor: Maurine Minister Laurence Ferrari, MD (Physician)               [facility]   [facility]   [phone]   [place], [place] [address]   [phone]      C/C: Diabetes      HPI:      Ryan Monroe is a 40 y.o.  BLACK/A[place] AMER[place]  male who presents to clinic today for evaluation of the issues listed above.    Subjective;      Diabetes:   Diagnosed about 6 years ago (~2016).      Currently started back on NPH 10 units BID and Jardiance 10mg  daily. Compliant with meds and tolerating well.   No  polyuria or polydipsia.   Last A1C 14.   Lantus too expensive for patient.   Metformin cause stomach upset.      Pt denies any  fever, chill, chest pain, SOB, abdominal pain, n/v/d, HA or dizziness.       Of note, pt states that that at the time of diagnosis his blood glucose was in the 1200s for which he was admitted in . Was on insulin which he stopped taking for about 3 years before coming to reestablish with our practice.         Other Health Habits and social history:   Smoking history: no   Alcohol history:no   Occupation:works at [employer]   Marital status: Patient has 2 children. Not married.      Allergies- reviewed:      Allergies        Allergen  Reactions         ?  Metformin  Diarrhea           Past Medical History- reviewed:     Past Medical History:        Diagnosis  Date         ?  Asthma           ?  Diabetes (HCC)             Family History - reviewed:     Family History         Problem  Relation  Age of Onset          ?  Hypertension  Mother       ?  Asthma  Mother       ?  No Known Problems  Father       ?  No Known Problems  Sister       ?  No Known Problems  Sister       ?  No Known Problems  Sister       ?  No Known  Problems  Sister       ?  No Known Problems  Sister       ?  No Known Problems  Sister            ?  No Known Problems  Brother             Social  History - reviewed:     Social History          Socioeconomic History         ?  Marital status:  SINGLE              Spouse name:  Not on file         ?  Number of children:  Not on file     ?  Years of education:  Not on file     ?  Highest education level:  Not on file       Occupational History        ?  Not on file       Tobacco Use         ?  Smoking status:  Never              Passive exposure:  Never         ?  Smokeless tobacco:  Never       Vaping Use         ?  Vaping Use:  Never used       Substance and Sexual Activity         ?  Alcohol use:  Yes             Comment: Socila         ?  Drug use:  Never     ?  Sexual activity:  Not Currently        Other Topics  Concern        ?  Not on file       Social History Narrative        ?  Not on file          Social Determinants of Health          Financial Resource Strain: Not on file     Food Insecurity: Not on file     Transportation Needs: Not on file     Physical Activity: Not on file     Stress: Not on file     Social Connections: Not on file     Intimate Partner Violence: Not on file       Housing Stability: Not on file           Depression screening:      3 most recent PHQ Screens  07/21/2021        Little interest or pleasure in doing things  Not at all     Feeling down, depressed, irritable, or hopeless  Not at all     Total Score PHQ 2  0     Trouble falling or staying asleep, or sleeping too much  Not at all     Feeling tired or having little energy  Not at all     Poor appetite, weight loss, or overeating  Not at all     Feeling bad about yourself - or that you are a failure or have let yourself or your family down  Not at all     Trouble concentrating on things such as school, work, reading, or watching TV  Not at all     Moving or speaking so slowly that other people could have noticed; or the opposite  being so fidgety that others notice  Not at all     Thoughts of being better off dead, or hurting yourself in some way  Not at all  PHQ 9 Score  0        How difficult have these problems made it for you to do your work, take care of your home and get along with others  Not difficult at all              Review of systems:       A comprehensive review of systems was negative except for that written in the History of Present Illness.          Visit Vitals      BP  123/85 (BP 1 Location: Left upper arm, BP Patient Position: Sitting, BP Cuff Size: Adult)     Pulse  86     Temp  97.8 ??F (36.6 ??C) (Oral)     Resp  16     Ht  5' 10.5" (1.791 m)     Wt  187 lb (84.8 kg)     SpO2  97%        BMI  26.45 kg/m??           General: Alert and oriented, in no acute distress. Well nourished.    EYE: PERRL. Sclera and conjuctival clear. Extraocular movements intact.   EARS: External normal, canals clear, tympanic membranes normal.    NOSE: Mucosa healthy without drainage or ulceration.   OROPHARYNX: No suspicious lesions, normal dentition, pharynx, tongue and tonsils normal.   NECK: Supple; no masses; thyroid normal.   LUNGS: Respirations unlabored; clear to auscultation bilaterally.   CARDIOVASCULAR: Regular, rate, and rhythm without murmurs, gallops or rubs.   ABDOMEN: Soft; nontender; nondistended; normoactive bowel sounds; no masses or organomegaly.   MUSCULOSKELETAL: FROM in all extremities      EXT: No edema. Neurovascularlly intact. Normal gait.   SKIN: No rash. No suspicious lesions or moles.   Neuro: Mental Status: Pt is alert and oriented to person, place, and time.           Assessment/Plan                  ICD-10-CM  ICD-9-CM             1.  Diabetes mellitus due to underlying condition with hyperglycemia, without long-term current use of insulin (HCC)   E08.65  249.80  AMB POC HEMOGLOBIN A1C              790.29  insulin NPH/insulin regular (NOVOLIN 70/30, HUMULIN 70/30) 100 unit/mL (70-30) injection                   2.  Type II or unspecified type diabetes mellitus with neurological manifestations, uncontrolled(250.62) (HCC)   E11.49  250.62              1. Diabetes mellitus due to underlying condition with hyperglycemia, without long-term current use of insulin (HCC)        Lab Results         Component  Value  Date/Time            Hemoglobin A1c (POC)  11.6  07/21/2021 02:20 PM        A1C improving.   Cannot tolerate Metformin.   Lantus not covered by his insurance.       - increasing NPH; insulin NPH/insulin regular (NOVOLIN 70/30, HUMULIN 70/30) 100 unit/mL (70-30) injection; 20 Units by SubCUTAneous route Before breakfast and dinner.      - continue empagliflozin (JARDIANCE) 10 mg tablet; Take 1 Tablet by mouth daily.    -  continue working on lifestyle changes      Follow up:2-3 months or sooner if needed      I have discussed the diagnosis with the patient and the intended plan as seen in the above orders.  The patient has received an after-visit summary and questions were answered concerning future plans.  I have discussed medication side effects and warnings  with the patient as well. Informed patient to return to the office if new symptoms arise.         Signed By:  Rene Paci, MD           July 21, 2021

## 2021-08-22 ENCOUNTER — Ambulatory Visit: Attending: Internal Medicine | Primary: Family Medicine

## 2021-09-22 ENCOUNTER — Inpatient Hospital Stay
Admit: 2021-09-22 | Discharge: 2021-09-22 | Disposition: A | Discharge status: Home / Self Care | Payer: MEDICAID | Attending: Student in an Organized Health Care Education/Training Program

## 2021-09-22 DIAGNOSIS — J029 Acute pharyngitis, unspecified: Secondary | ICD-10-CM

## 2021-09-22 MED ORDER — PHENOL 1.4 % MUCOSAL AEROSOL SPRAY
1.4 % | 0 refills | Status: AC | PRN
Start: 2021-09-22 — End: 2021-09-27

## 2021-09-22 NOTE — ED Notes (Signed)
Pt stated he woke this am with a sore throat, denies fever, the sore throat caused his asthma to act up, used his inhaler x 5 today, no resp distress noted, resp even and unlabored

## 2021-09-22 NOTE — ED Provider Notes (Signed)
ED Provider Notes by Hewitt Shorts, NP at 09/22/21 1211                Author: Hewitt Shorts, NP  Service: EMERGENCY  Author Type: Nurse Practitioner       Filed: 09/22/21 1407  Date of Service: 09/22/21 1211  Status: Attested           Editor: Hewitt Shorts, NP (Nurse Practitioner)  Cosigner: Bertha Stakes., MD at 09/22/21 2301          Attestation signed by Bertha Stakes., MD at 09/22/21 2301          I was personally available for consultation in the emergency department.  I have reviewed the chart and agree with the documentation recorded by the [facility], including  the assessment, treatment plan, and disposition.      The patient presented with    Chief Complaint      Patient presents with      ?  Sore Throat                   ICD-10-CM  ICD-9-CM        1.  Sore throat   J02.9  462                        Adam D. Michel Harrow, MD                                 Patient is a 40 year old male with past medical history of asthma, diabetes presenting for evaluation of sore throat.   Reports sore throat started approximately 2 to 3 hours ago when he woke up.  Felt that a sore throat was making it difficult for him to breathe, so used his albuterol inhaler 5 times in this timeframe.  Denies shortness of breath currently.  Reports  generalized headache and chills as well.  Has not had fevers.  No difficulty swallowing.  No voice change.  Did not take anything for symptoms at home.               Past Medical History:        Diagnosis  Date         ?  Asthma           ?  Diabetes [facility])               Past Surgical History:         Procedure  Laterality  Date          ?  HX CHOLECYSTECTOMY              2009               Family History:         Problem  Relation  Age of Onset          ?  Hypertension  Mother       ?  Asthma  Mother       ?  No Known Problems  Father       ?  No Known Problems  Sister       ?  No Known Problems  Sister       ?  No Known Problems  Sister       ?  No Known Problems  Sister        ?  No Known Problems  Sister       ?  No Known Problems  Sister            ?  No Known Problems  Brother               Social History          Socioeconomic History         ?  Marital status:  SINGLE              Spouse name:  Not on file         ?  Number of children:  Not on file     ?  Years of education:  Not on file     ?  Highest education level:  Not on file       Occupational History        ?  Not on file       Tobacco Use         ?  Smoking status:  Never              Passive exposure:  Never         ?  Smokeless tobacco:  Never       Vaping Use         ?  Vaping Use:  Never used       Substance and Sexual Activity         ?  Alcohol use:  Yes             Comment: Socila         ?  Drug use:  Never     ?  Sexual activity:  Not Currently        Other Topics  Concern        ?  Not on file       Social History Narrative        ?  Not on file          Social Determinants of Health          Financial Resource Strain: Not on file     Food Insecurity: Not on file     Transportation Needs: Not on file     Physical Activity: Not on file     Stress: Not on file     Social Connections: Not on file     Intimate Partner Violence: Not on file       Housing Stability: Not on file              ALLERGIES: Metformin      Review of Systems    Constitutional:  Positive for chills. Negative for unexpected weight change.    HENT:  Positive for sore throat. Negative for trouble swallowing and voice change.     Eyes:  Negative for visual disturbance.    Respiratory:  Negative for cough, chest tightness and shortness of breath.     Cardiovascular:  Negative for chest pain and palpitations.    Gastrointestinal:  Negative for abdominal pain, nausea and vomiting.    Endocrine: Negative for polyuria.    Genitourinary:  Negative for dysuria and flank pain.    Musculoskeletal:  Negative for back pain.    Skin:  Negative for color change.    Allergic/Immunologic: Negative for immunocompromised state.    Neurological:  Positive for  headaches. Negative for dizziness.    Hematological:  Negative for adenopathy.  Psychiatric/Behavioral:  Negative for agitation.          Vitals:          09/22/21 1209        BP:  120/83     Pulse:  (!) 112     Resp:  16     Temp:  97.4 ??F (36.3 ??C)        SpO2:  96%                Physical Exam   Vitals and nursing note reviewed.    Constitutional:        General: He is not in acute distress.      Appearance: He is well-developed and normal weight.    HENT:       Head: Atraumatic.       Mouth/Throat:       Pharynx: Uvula midline. No uvula swelling.       Tonsils: No tonsillar exudate or tonsillar abscesses. 0 on the right. 0  on the left.    Eyes:       Pupils: Pupils are equal, round, and reactive to light.     Cardiovascular:       Rate and Rhythm: Normal rate and regular rhythm.       Heart sounds: Normal heart sounds.    Pulmonary:       Effort: Pulmonary effort is normal.       Breath sounds: Normal breath sounds. No wheezing.     Abdominal:       General: Bowel sounds are normal.       Palpations: Abdomen is soft.     Musculoskeletal:       Cervical back: Neck supple.     Lymphadenopathy:       Cervical: No cervical adenopathy.    Skin:      General: Skin is warm and dry.       Capillary Refill: Capillary refill takes less than 2 seconds.    Neurological:       General: No focal deficit present.       Mental Status: He is alert and oriented to person, place, and time.    Psychiatric:          Mood and Affect: Mood normal.          Behavior: Behavior normal.           MDM   Number of Diagnoses or Management Options   Sore throat   Diagnosis management comments: Patient presenting with sore throat.  Physical exam revealing no signs of peritonsillar abscess, no exudate, no tonsillar swelling.  He has no hot potato voice.  I do not see any evidence of peritonsillar abscess.  He is  slightly tachycardic on exam, but lungs are clear to auscultation with no wheezing.  I suspect tachycardia is related to using  albuterol inhaler 5 times within a very short timeframe.  Will obtain strep throat swab to rule out strep pharyngitis.  Other  differentials including viral etiology.      1406 -rapid strep negative.  Advised patient that he will be called if his throat culture grows strep and he will be prescribed antibiotics.  However, I have low clinical suspicion given the appearance of his tonsils.  We will treat patient with Chloraseptic  throat spray.  Encouraged ibuprofen versus Advil at home as well.  Discussed my clinical impression(s), any labs and/or radiology results with the patient. I answered any questions and  addressed any concerns. Discussed the importance of following up with  their primary care physician and/or specialist(s). Discussed signs or symptoms that would warrant return back to the ER for further evaluation. The patient is agreeable with discharge.          Amount and/or Complexity of Data Reviewed   Clinical lab tests: ordered and reviewed                Procedures

## 2021-09-24 ENCOUNTER — Ambulatory Visit: Attending: Family Medicine | Primary: Family Medicine

## 2021-09-24 LAB — CULTURE, THROAT
Culture result:: NORMAL
Culture: NORMAL

## 2021-09-24 LAB — STREP AG SCREEN, GROUP A
Group A Strep Ag ID: NEGATIVE
Strep A Ag: NEGATIVE

## 2021-10-01 ENCOUNTER — Telehealth: Admit: 2021-10-01 | Discharge: 2021-10-01 | Payer: MEDICAID | Attending: Family Medicine | Primary: Family Medicine

## 2021-10-01 DIAGNOSIS — J019 Acute sinusitis, unspecified: Secondary | ICD-10-CM

## 2021-10-01 MED ORDER — AMOXICILLIN CLAVULANATE 875 MG-125 MG TAB
875-125 mg | ORAL_TABLET | Freq: Two times a day (BID) | ORAL | 0 refills | Status: AC
Start: 2021-10-01 — End: 2021-10-08

## 2021-10-01 NOTE — Progress Notes (Signed)
[place] [facility]  [facility]  [phone]. [address]  [place], [place] [address]  [phone]    Ryan Monroe (DOB: 24-Feb-1981) is a 40 y.o. male, established patient, here for evaluation of the following chief complaint(s): URI    SUBJECTIVE:    Pt would like to be evaluated for the problem(s) listed above:    Pt had cough, nasal congestion and maxillary sinus pressure for about a week ago.  Rapid strep was negative. Treated conservatively.  States that he has been hoarse over the past few days and still has a cough. Sore throat resolved.  He has been taking mucinex.  Denies any  fever, chills, sinus pressure, nausea/vomiting, sneezing, wheezing or abd pain.    Review of systems:     A comprehensive review of systems was negative except for that written in the History of Present Illness.       PHYSICAL EXAM:    General: alert, cooperative, no distress   Mental  status: mental status: alert, oriented to person, place, and time, normal mood, behavior, speech, dress, motor activity, and thought processes   Resp: resp: normal effort and no respiratory distress   Neuro: neuro: no gross deficits   Skin: skin: no discoloration or lesions of concern on visible areas   Due to this being a TeleHealth evaluation, many elements of the physical examination are unable to be assessed.      ASSESSMENT/PLAN:      ICD-10-CM ICD-9-CM    1. Acute non-recurrent sinusitis, unspecified location  J01.90 461.9 amoxicillin-clavulanate (AUGMENTIN) 875-125 mg per tablet        1. Acute non-recurrent sinusitis, unspecified location    - amoxicillin-clavulanate (AUGMENTIN) 875-125 mg per tablet; Take 1 Tablet by mouth every twelve (12) hours for 7 days.     - Continue conservative measures; Tylenol/NSAIDs for pain, Flonase/saline rinse for congestion  - Discussed precautions with patient    - RTC prn for review if persistent symptoms, or go to the ER if worsening         Return if symptoms worsen or fail to improve.    We  discussed the expected course, resolution and complications of the diagnosis(es) in detail. Patient was in [place] at the time of consultation. Medication risks, benefits, costs, interactions, and alternatives were discussed as indicated.  I advised him to contact the office if his condition worsens, changes or fails to improve as anticipated. He expressed understanding with the diagnosis(es) and plan. Patient understands that this encounter was a temporary measure, and the importance of further follow up and examination was emphasized.  Patient verbalized understanding.         Ryan Monroe is being evaluated by a Virtual Visit (video visit) encounter to address concerns as mentioned above.  A caregiver was present when appropriate. Due to this being a Scientist, research (medical) (During COVID-19 public health emergency), evaluation of the following organ systems was limited: Vitals/Constitutional/EENT/Resp/CV/GI/GU/MS/Neuro/Skin/Heme-Lymph-Imm.  Pursuant to the emergency declaration under the [facility] Act and the [employer], 1135 waiver authority and the [employer] Act, this Virtual Visit was conducted with patient's (and/or legal guardian's) consent, to reduce the patient's risk of exposure to COVID-19 and provide necessary medical care.  The patient (and/or legal guardian) has also been advised to contact this office for worsening conditions or problems, and seek emergency medical treatment and/or call 911 if deemed necessary.    Patient identification was verified at the start of the visit:  YES    Services were provided through a video synchronous discussion virtually to substitute for in-person clinic visit. Patient was located at home and provider was located in office or at home.     An electronic signature was used to authenticate this note.    -- Rene Paci, MD     CPT Codes [phone] for Established Patients may apply to this  Telehealth Visit.  POS code: 64.  Modifier GT

## 2021-10-01 NOTE — Progress Notes (Signed)
Name and DOB Verified.     Pharmacy verified     Chief Complaint   Patient presents with    Hoarse     Since Sunday 09/28/2021     Patient went to ED 09/22/2021 Shortness of Breath [facility] Test POSITIVE for STREP. He was NOT treat with any Antibiotic.    1. Have you been to the ER, urgent care clinic since your last visit?  Hospitalized since your last visit?    Yes  09/22/2021  [facility]  POSITIVE Strep    2. Have you seen or consulted any other health care providers outside of the [facility] since your last visit?  Include any pap smears or colon screening. No    Health Maintenance Due   Topic Date Due    Hepatitis C Screening  Never done    COVID-19 Vaccine (1) Never done    Pneumococcal 0-64 years (1 - PCV) Never done    Foot Exam Q1  Never done    Eye Exam Retinal or Dilated  Never done    Hepatitis B Vaccine (1 of 3 - Risk 3-dose series) Never done    DTaP/Tdap/Td series (1 - Tdap) Never done    Flu Vaccine (1) Never done

## 2021-10-15 ENCOUNTER — Encounter

## 2021-10-15 MED ORDER — JARDIANCE 10 MG TABLET
10 mg | ORAL_TABLET | ORAL | 3 refills | Status: AC
Start: 2021-10-15 — End: ?

## 2021-11-15 ENCOUNTER — Encounter

## 2021-11-17 MED ORDER — ALBUTEROL SULFATE HFA 90 MCG/ACTUATION AEROSOL INHALER
90 mcg/actuation | RESPIRATORY_TRACT | 5 refills | Status: AC
Start: 2021-11-17 — End: ?

## 2021-11-21 ENCOUNTER — Ambulatory Visit: Attending: Family Medicine | Primary: Family Medicine

## 2021-12-10 ENCOUNTER — Ambulatory Visit: Admit: 2021-12-10 | Discharge: 2021-12-10 | Payer: MEDICAID | Attending: Family Medicine | Primary: Family Medicine

## 2021-12-10 ENCOUNTER — Ambulatory Visit: Attending: Family Medicine | Primary: Family Medicine

## 2021-12-10 DIAGNOSIS — E1165 Type 2 diabetes mellitus with hyperglycemia: Secondary | ICD-10-CM

## 2021-12-10 LAB — AMB POC HEMOGLOBIN A1C
Hemoglobin A1C, POC: 13.4 %
Hemoglobin A1c (POC): 13.4 %

## 2021-12-10 MED ORDER — GLIPIZIDE 10 MG TAB
10 mg | ORAL_TABLET | Freq: Every day | ORAL | 0 refills | Status: AC
Start: 2021-12-10 — End: ?

## 2021-12-10 MED ORDER — INSULIN NPH/INSULIN REGULAR (70/30) 100 UNIT/ML INJECTION
100 unit/mL (70-30) | Freq: Two times a day (BID) | SUBCUTANEOUS | 3 refills | Status: AC
Start: 2021-12-10 — End: ?

## 2021-12-10 MED ORDER — ATORVASTATIN 40 MG TAB
40 mg | ORAL_TABLET | Freq: Every day | ORAL | 1 refills | Status: AC
Start: 2021-12-10 — End: ?

## 2021-12-10 NOTE — Progress Notes (Signed)
 Name and DOB Verified.     Pharmacy verified     Chief Complaint   Patient presents with    Follow-up     3 Months 07/21/2021 Diabetes     Patient fasting for labs, [facility]    1. Have you been to the ER, urgent care clinic since your last visit?  Hospitalized since your last visit?No    2. Have you seen or consulted any other health care providers outside of the [facility] since your last visit?  Include any pap smears or colon screening. No      Health Maintenance Due   Topic Date Due    Hepatitis C Screening  Never done    Pneumococcal 0-64 years (1 - PCV) Never done    Foot Exam Q1  Never done    Eye Exam Retinal or Dilated  Never done    Hepatitis B Vaccine (1 of 3 - Risk 3-dose series) Never done    DTaP/Tdap/Td series (1 - Tdap) Never done    COVID-19 Vaccine (4 - Booster for Pfizer series) 03/18/2021    A1C test (Diabetic or Prediabetic)  10/21/2021

## 2021-12-10 NOTE — Progress Notes (Signed)
HLD- improved

## 2021-12-10 NOTE — Progress Notes (Signed)
[place] [facility] [place]  [facility]  [phone]. [address]  [place], [place] [address]  [phone]    C/C: Diabetes    HPI:    Ryan Monroe is a 41 y.o.  BLACK/AFRICAN AMERICAN  male who presents to clinic today for evaluation of the issues listed above.   Subjective;    Diabetes:  Diagnosed around 2016.  Poor controlled.  Non compliant with meds or lifestyle changes.  Diet remains poor.      Currently started back on NPH 20 units BID and Jardiance 10mg  daily. Admits to skipping insulin doses especially at night since he works night shift.  No  polyuria or polydipsia.    Lantus too expensive for patient.  Metformin cause stomach upset.    Pt denies any  fever, chill, chest pain, SOB, abdominal pain, n/v/d, HA or dizziness.     Of note, pt states that that at the time of diagnosis his blood glucose was in the 1200s for which he was admitted in [place]. Was on insulin which he stopped taking for about 3 years before coming to reestablish with our practice.      Other Health Habits and social history:  Smoking history: no  Alcohol history:no  Occupation:works at [employer]  Marital status: Patient has 2 children. Not married.    Allergies- reviewed:   Allergies   Allergen Reactions    Metformin Diarrhea       Past Medical History- reviewed:  Past Medical History:   Diagnosis Date    Asthma     Diabetes (HCC)        Family History - reviewed:  Family History   Problem Relation Age of Onset    Hypertension Mother     Asthma Mother     No Known Problems Father     No Known Problems Sister     No Known Problems Sister     No Known Problems Sister     No Known Problems Sister     No Known Problems Sister     No Known Problems Sister     No Known Problems Brother        Social History - reviewed:  Social History     Socioeconomic History    Marital status: SINGLE     Spouse name: Not on file    Number of children: Not on file    Years of education: Not on file    Highest education level: Not on  file   Occupational History    Not on file   Tobacco Use    Smoking status: Never     Passive exposure: Never    Smokeless tobacco: Never   Vaping Use    Vaping Use: Never used   Substance and Sexual Activity    Alcohol use: Yes     Comment: Social    Drug use: Never    Sexual activity: Not Currently   Other Topics Concern    Not on file   Social History Narrative    Not on file     Social Determinants of Health     Financial Resource Strain: Not on file   Food Insecurity: Not on file   Transportation Needs: Not on file   Physical Activity: Not on file   Stress: Not on file   Social Connections: Not on file   Intimate Partner Violence: Not on file   Housing Stability: Not on file  Depression screening:  3 most recent PHQ Screens 07/21/2021   Little interest or pleasure in doing things Not at all   Feeling down, depressed, irritable, or hopeless Not at all   Total Score PHQ 2 0   Trouble falling or staying asleep, or sleeping too much Not at all   Feeling tired or having little energy Not at all   Poor appetite, weight loss, or overeating Not at all   Feeling bad about yourself - or that you are a failure or have let yourself or your family down Not at all   Trouble concentrating on things such as school, work, reading, or watching TV Not at all   Moving or speaking so slowly that other people could have noticed; or the opposite being so fidgety that others notice Not at all   Thoughts of being better off dead, or hurting yourself in some way Not at all   PHQ 9 Score 0   How difficult have these problems made it for you to do your work, take care of your home and get along with others Not difficult at all         Review of systems:     A comprehensive review of systems was negative except for that written in the History of Present Illness.       Visit Vitals  BP 116/77 (BP 1 Location: Right arm, BP Patient Position: Sitting, BP Cuff Size: Adult)   Pulse 91   Temp 98.2 ??F (36.8 ??C) (Oral)   Resp 17   Ht 5' 10.5"  (1.791 m)   Wt 183 lb (83 kg)   SpO2 97%   BMI 25.89 kg/m??       General: Alert and oriented, in no acute distress. Well nourished.   EYE: PERRL. Sclera and conjuctival clear. Extraocular movements intact.  EARS: External normal, canals clear, tympanic membranes normal.   NOSE: Mucosa healthy without drainage or ulceration.  OROPHARYNX: No suspicious lesions, normal dentition, pharynx, tongue and tonsils normal.  NECK: Supple; no masses; thyroid normal.  LUNGS: Respirations unlabored; clear to auscultation bilaterally.  CARDIOVASCULAR: Regular, rate, and rhythm without murmurs, gallops or rubs.  ABDOMEN: Soft; nontender; nondistended; normoactive bowel sounds; no masses or organomegaly.  MUSCULOSKELETAL: FROM in all extremities     EXT: No edema. Neurovascularlly intact. Normal gait.  SKIN: No rash. No suspicious lesions or moles.  Neuro: Mental Status: Pt is alert and oriented to person, place, and time.      Assessment/Plan       ICD-10-CM ICD-9-CM    1. Uncontrolled type 2 diabetes mellitus with hyperglycemia (HCC)  E11.65 250.02 AMB POC HEMOGLOBIN A1C      HM DIABETES FOOT EXAM      METABOLIC PANEL, BASIC      METABOLIC PANEL, BASIC      2. Mixed hyperlipidemia  E78.2 272.2 LIPID PANEL      LIPID PANEL      3. Weak urinary stream  R39.12 788.62 PSA W/ REFLX FREE PSA      PSA W/ REFLX FREE PSA        1. Uncontrolled type 2 diabetes mellitus with hyperglycemia (HCC)  Noncompliant.  Lab Results   Component Value Date/Time    Hemoglobin A1c (POC) 13.4 12/10/2021 11:15 AM       - Encouraged to work on lifestyle modifications including diet and exercise.  - Continue home monitoring. Glucose goals; Fasting (80-130), preprandial (<140), 2-hr post-prandial (<180)  - Encouraged to  take his medications    - HM DIABETES FOOT EXAM  - METABOLIC PANEL, BASIC; Future  - METABOLIC PANEL, BASIC  - Increase to insulin NPH/insulin regular (NOVOLIN 70/30, HUMULIN 70/30) 100 unit/mL (70-30) injection; 30 Units by SubCUTAneous route  Before breakfast and dinner.   - start  glipiZIDE (GLUCOTROL) 10 mg tablet; Take 1 Tablet by mouth daily.   -continue Jardiance  - C-PEPTIDE; Future  - REFERRAL TO OPHTHALMOLOGY    2. Mixed hyperlipidemia    - LIPID PANEL; Future  - start atorvastatin (LIPITOR) 40 mg tablet; Take 1 Tablet by mouth daily.      3. Weak urinary stream  - PSA W/ REFLX FREE PSA; Future  - PSA W/ REFLX FREE PSA      Follow up:2-3 months or sooner if needed    I have discussed the diagnosis with the patient and the intended plan as seen in the above orders.  The patient has received an after-visit summary and questions were answered concerning future plans.  I have discussed medication side effects and warnings with the patient as well. Informed patient to return to the office if new symptoms arise.    Signed By: Rene Paci, MD     December 10, 2021

## 2021-12-11 LAB — C-PEPTIDE
C-Peptide: 1.7 ng/mL (ref 1.1–4.4)
C-Peptide: 1.7 ng/mL (ref 1.1–4.4)

## 2021-12-11 LAB — LIPID PANEL
Cholesterol, Total: 239 mg/dL — ABNORMAL HIGH (ref 100–199)
Cholesterol, total: 239 mg/dL — ABNORMAL HIGH (ref 100–199)
HDL Cholesterol: 82 mg/dL (ref >39)
HDL: 82 mg/dL (ref >39)
LDL Calculated: 139 mg/dL — ABNORMAL HIGH (ref 0–99)
LDL, calculated: 139 mg/dL — ABNORMAL HIGH (ref 0–99)
Triglyceride: 105 mg/dL (ref 0–149)
Triglycerides: 105 mg/dL (ref 0–149)
VLDL, calculated: 18 mg/dL (ref 5–40)
VLDL: 18 mg/dL (ref 5–40)

## 2021-12-11 LAB — METABOLIC PANEL, BASIC
BUN/Creatinine ratio: 13 (ref 9–20)
BUN: 11 mg/dL (ref 6–24)
CO2: 20 mmol/L (ref 20–29)
Calcium: 9.5 mg/dL (ref 8.7–10.2)
Chloride: 99 mmol/L (ref 96–106)
Creatinine: 0.86 mg/dL (ref 0.76–1.27)
Glucose: 368 mg/dL — ABNORMAL HIGH (ref 70–99)
Potassium: 4.3 mmol/L (ref 3.5–5.2)
Sodium: 136 mmol/L (ref 134–144)
eGFR: 112 mL/min/{1.73_m2} (ref >59)

## 2021-12-11 LAB — PSA W/ REFLX FREE PSA
PSA: 0.8 ng/mL (ref 0.0–4.0)
Prostate Specific Ag: 0.8 ng/mL (ref 0.0–4.0)

## 2021-12-11 LAB — BASIC METABOLIC PANEL
BUN: 11 mg/dL (ref 6–24)
Bun/Cre Ratio: 13 NA (ref 9–20)
CO2: 20 mmol/L (ref 20–29)
Calcium: 9.5 mg/dL (ref 8.7–10.2)
Chloride: 99 mmol/L (ref 96–106)
Creatinine: 0.86 mg/dL (ref 0.76–1.27)
Est, Glomerular Filtration Rate: 112 mL/min/{1.73_m2} (ref >59)
Glucose: 368 mg/dL — ABNORMAL HIGH (ref 70–99)
Potassium: 4.3 mmol/L (ref 3.5–5.2)
Sodium: 136 mmol/L (ref 134–144)

## 2022-03-12 ENCOUNTER — Encounter: Attending: Family Medicine | Primary: Family Medicine

## 2022-03-17 MED ORDER — GLIPIZIDE 10 MG PO TABS
10 MG | ORAL_TABLET | Freq: Every day | ORAL | 0 refills | Status: AC
Start: 2022-03-17 — End: ?

## 2022-03-17 NOTE — Telephone Encounter (Signed)
Last appointment: 12/10/21  Next appointment: no show 03/12/22  Previous refill encounter(s): 12/10/21 #90    Requested Prescriptions     Pending Prescriptions Disp Refills    glipiZIDE (GLUCOTROL) 10 MG tablet [Pharmacy Med Name: GLIPIZIDE 10MG  TABLETS] 90 tablet 0     Sig: Take 1 tablet by mouth daily Patient needs an appointment for further refills         For Pharmacy Admin Tracking Only    Program: Medication Refill  CPA in place:    Recommendation Provided To:   Intervention Detail: New Rx: 1, reason: Patient Preference  Intervention Accepted By:   Closed?:    Time Spent (min): 5

## 2022-03-24 ENCOUNTER — Encounter

## 2022-03-24 NOTE — Telephone Encounter (Signed)
Last appointment: 12/10/21  Next appointment: no show 03/12/22  Previous refill encounter(s): 12/10/21 #77ml with 3 refills    Requested Prescriptions     Pending Prescriptions Disp Refills    insulin 70-30 (HUMULIN 70/30) (70-30) 100 UNIT per ML injection vial [Pharmacy Med Name: HUMULIN 70/30 INJ, 3ML] 60 mL 3     Sig: Inject 30 Units into the skin 2 times daily (before meals)         For Pharmacy Admin Tracking Only    Program: Medication Refill  CPA in place:    Recommendation Provided To:   Intervention Detail: New Rx: 1, reason: Patient Preference  Intervention Accepted By:   Eugenia Pancoast Closed?:    Time Spent (min): 5

## 2022-03-24 NOTE — Telephone Encounter (Signed)
Last appointment: 12/10/21  Next appointment: no show 03/12/22  Previous refill encounter(s): 10/15/21 #30 with 3 refills    Requested Prescriptions     Pending Prescriptions Disp Refills    JARDIANCE 10 MG tablet [Pharmacy Med Name: JARDIANCE 10MG  TABLETS] 90 tablet 3     Sig: TAKE 1 TABLET BY MOUTH DAILY         For Pharmacy Admin Tracking Only    Program: Medication Refill  CPA in place:    Recommendation Provided To:   Intervention Detail: New Rx: 1, reason: Patient Preference  Intervention Accepted By:   Closed?:    Time Spent (min): 5

## 2022-03-25 MED ORDER — INSULIN NPH ISOPHANE & REGULAR (70-30) 100 UNIT/ML SC SUSP
Freq: Two times a day (BID) | SUBCUTANEOUS | 0 refills | Status: AC
Start: 2022-03-25 — End: ?

## 2022-03-25 MED ORDER — JARDIANCE 10 MG PO TABS
10 MG | ORAL_TABLET | ORAL | 0 refills | Status: AC
Start: 2022-03-25 — End: ?

## 2022-08-10 NOTE — Telephone Encounter (Signed)
Last appointment: 12/10/21  Next appointment: no show 03/12/22  Previous refill encounter(s): 11/17/21 #1 with 5 refills    Requested Prescriptions     Pending Prescriptions Disp Refills    albuterol sulfate HFA (PROVENTIL;VENTOLIN;PROAIR) 108 (90 Base) MCG/ACT inhaler [Pharmacy Med Name: ALBUTEROL HFA INH(200 PUFFS) 18GM] 18 g 0     Sig: INHALE 2 PUFFS BY MOUTH EVERY 6 HOURS AS NEEDED FOR WHEEZING         For Pharmacy Admin Tracking Only    Program: Medication Refill  CPA in place:    Recommendation Provided To:   Intervention Detail: New Rx: 1, reason: Patient Preference  Intervention Accepted By:   Jamse Arn Closed?:    Time Spent (min): 5

## 2022-08-11 NOTE — Telephone Encounter (Signed)
Patient is requesting a RX refill albuterol sulfate HFA (PROVENTIL;VENTOLIN;PROAIR) 108 (90 Base) MCG/ACT inhaler he can be reached @ [phone]

## 2022-08-12 MED ORDER — ALBUTEROL SULFATE HFA 108 (90 BASE) MCG/ACT IN AERS
108 (90 Base) MCG/ACT | Freq: Four times a day (QID) | RESPIRATORY_TRACT | 0 refills | Status: DC | PRN
Start: 2022-08-12 — End: 2022-10-23

## 2022-08-12 MED ORDER — ALBUTEROL SULFATE HFA 108 (90 BASE) MCG/ACT IN AERS
108 (90 Base) MCG/ACT | Freq: Four times a day (QID) | RESPIRATORY_TRACT | 2 refills | Status: AC | PRN
Start: 2022-08-12 — End: ?

## 2022-10-21 NOTE — Telephone Encounter (Addendum)
Patient has scheduled an appt. He is almost out and is requesting this refill in the meantime.    Last appointment: 12/10/21  Next appointment: 12/14/22  Previous refill encounter(s): 08/12/22 #1 with 2 refills    Requested Prescriptions     Pending Prescriptions Disp Refills    albuterol sulfate HFA (PROVENTIL;VENTOLIN;PROAIR) 108 (90 Base) MCG/ACT inhaler [Pharmacy Med Name: ALBUTEROL HFA INH (200 PUFFS) 8.5GM] 1 each 0     Sig: INHALE 2 PUFFS INTO THE LUNGS EVERY 6 HOURS AS NEEDED FOR WHEEZING OR SHORTNESS OF BREATH         For Pharmacy Admin Tracking Only    Program: Medication Refill  CPA in place:    Recommendation Provided To:   Intervention Detail: New Rx: 1, reason: Patient Preference and Scheduled Appointment  Intervention Accepted By:   Jamse Arn Closed?:    Time Spent (min): 5

## 2022-10-23 MED ORDER — ALBUTEROL SULFATE HFA 108 (90 BASE) MCG/ACT IN AERS
108 (90 Base) MCG/ACT | Freq: Four times a day (QID) | RESPIRATORY_TRACT | 0 refills | Status: AC | PRN
Start: 2022-10-23 — End: ?

## 2022-12-14 ENCOUNTER — Encounter: Attending: Family Medicine | Primary: Family Medicine

## 2022-12-18 ENCOUNTER — Ambulatory Visit: Attending: Family Medicine | Primary: Family Medicine

## 2023-12-21 MED ORDER — ALBUTEROL SULFATE HFA 108 (90 BASE) MCG/ACT IN AERS
108 | RESPIRATORY_TRACT | 0 refills | Status: AC
Start: 2023-12-21 — End: ?

## 2023-12-21 NOTE — Telephone Encounter (Signed)
 Last appointment: 12/10/21  Next appointment: none  Previous refill encounter(s): 10/23/22 #1    Requested Prescriptions     Pending Prescriptions Disp Refills    albuterol sulfate HFA (PROVENTIL;VENTOLIN;PROAIR) 108 (90 Base) MCG/ACT inhaler [Pharmacy Med Name: ALBUTEROL HFA INH (200 PUFFS) 8.5GM] 8.5 g 0     Sig: INHALE 2 PUFFS BY MOUTH INTO THE LUNGS EVERY 6HRS AS NEEDED FOR WHEEZING         For Pharmacy Admin Tracking Only    Program: Medication Refill  CPA in place:    Recommendation Provided To:   Intervention Detail: New Rx: 1, reason: Patient Preference  Intervention Accepted By:   Eugenia Pancoast Closed?:    Time Spent (min): 5
